# Patient Record
Sex: Male | Born: 1935 | Race: Black or African American | Hispanic: No | Smoking: Current every day smoker
Health system: Southern US, Community
[De-identification: ages and names within clinical notes are randomized; demographics above are authoritative.]

## PROBLEM LIST (undated history)

## (undated) DIAGNOSIS — H269 Unspecified cataract: Secondary | ICD-10-CM

## (undated) DIAGNOSIS — N429 Disorder of prostate, unspecified: Secondary | ICD-10-CM

## (undated) DIAGNOSIS — C801 Malignant (primary) neoplasm, unspecified: Secondary | ICD-10-CM

## (undated) DIAGNOSIS — I499 Cardiac arrhythmia, unspecified: Secondary | ICD-10-CM

## (undated) DIAGNOSIS — M1990 Unspecified osteoarthritis, unspecified site: Secondary | ICD-10-CM

## (undated) DIAGNOSIS — R413 Other amnesia: Secondary | ICD-10-CM

## (undated) DIAGNOSIS — I10 Essential (primary) hypertension: Secondary | ICD-10-CM

## (undated) HISTORY — PX: HERNIA REPAIR: SHX51

## (undated) HISTORY — DX: Unspecified osteoarthritis, unspecified site: M19.90

## (undated) HISTORY — DX: Essential (primary) hypertension: I10

## (undated) HISTORY — DX: Unspecified cataract: H26.9

---

## 2004-09-05 ENCOUNTER — Emergency Department: Payer: Self-pay | Admitting: Emergency Medicine

## 2004-09-05 ENCOUNTER — Other Ambulatory Visit: Payer: Self-pay

## 2007-06-02 ENCOUNTER — Emergency Department: Payer: Self-pay | Admitting: Emergency Medicine

## 2008-06-26 ENCOUNTER — Emergency Department: Payer: Self-pay | Admitting: Emergency Medicine

## 2008-08-15 ENCOUNTER — Emergency Department: Payer: Self-pay | Admitting: Emergency Medicine

## 2008-08-27 ENCOUNTER — Emergency Department: Payer: Self-pay | Admitting: Emergency Medicine

## 2009-05-04 ENCOUNTER — Emergency Department: Payer: Self-pay | Admitting: Unknown Physician Specialty

## 2010-01-05 ENCOUNTER — Ambulatory Visit: Payer: Self-pay | Admitting: Ophthalmology

## 2010-01-19 ENCOUNTER — Ambulatory Visit: Payer: Self-pay | Admitting: Ophthalmology

## 2011-04-28 ENCOUNTER — Emergency Department: Payer: Self-pay | Admitting: Emergency Medicine

## 2011-10-11 ENCOUNTER — Emergency Department: Payer: Self-pay | Admitting: Emergency Medicine

## 2011-10-13 ENCOUNTER — Emergency Department: Payer: Self-pay | Admitting: Unknown Physician Specialty

## 2012-02-08 ENCOUNTER — Ambulatory Visit: Payer: Self-pay | Admitting: Ophthalmology

## 2012-02-28 ENCOUNTER — Ambulatory Visit: Payer: Self-pay | Admitting: Ophthalmology

## 2013-04-16 ENCOUNTER — Ambulatory Visit: Payer: Self-pay | Admitting: Urology

## 2013-04-19 ENCOUNTER — Emergency Department: Payer: Self-pay | Admitting: Emergency Medicine

## 2013-04-19 LAB — COMPREHENSIVE METABOLIC PANEL
Anion Gap: 9 (ref 7–16)
BUN: 14 mg/dL (ref 7–18)
Chloride: 104 mmol/L (ref 98–107)
Creatinine: 0.98 mg/dL (ref 0.60–1.30)
EGFR (African American): >60
Glucose: 110 mg/dL — ABNORMAL HIGH (ref 65–99)
Osmolality: 277 (ref 275–301)
Potassium: 4.2 mmol/L (ref 3.5–5.1)
SGOT(AST): 29 U/L (ref 15–37)
SGPT (ALT): 28 U/L (ref 12–78)
Sodium: 138 mmol/L (ref 136–145)
Total Protein: 7.6 g/dL (ref 6.4–8.2)

## 2013-04-19 LAB — CBC
HGB: 14.2 g/dL (ref 13.0–18.0)
MCH: 25.3 pg — ABNORMAL LOW (ref 26.0–34.0)
MCHC: 32.6 g/dL (ref 32.0–36.0)
MCV: 78 fL — ABNORMAL LOW (ref 80–100)
Platelet: 166 10*3/uL (ref 150–440)
RBC: 5.62 10*6/uL (ref 4.40–5.90)
WBC: 4.6 10*3/uL (ref 3.8–10.6)

## 2013-04-22 ENCOUNTER — Ambulatory Visit: Payer: Self-pay | Admitting: Urology

## 2013-04-23 LAB — PATHOLOGY REPORT

## 2013-04-25 ENCOUNTER — Emergency Department: Payer: Self-pay | Admitting: Emergency Medicine

## 2013-04-25 LAB — TROPONIN I: Troponin-I: <0.02 ng/mL

## 2013-04-25 LAB — CBC
MCV: 77 fL — ABNORMAL LOW (ref 80–100)
RDW: 14 % (ref 11.5–14.5)

## 2013-04-25 LAB — COMPREHENSIVE METABOLIC PANEL
Albumin: 2.6 g/dL — ABNORMAL LOW (ref 3.4–5.0)
Calcium, Total: 8.7 mg/dL (ref 8.5–10.1)
Co2: 30 mmol/L (ref 21–32)
Creatinine: 0.95 mg/dL (ref 0.60–1.30)
EGFR (African American): >60
Glucose: 109 mg/dL — ABNORMAL HIGH (ref 65–99)
Potassium: 4.2 mmol/L (ref 3.5–5.1)
SGOT(AST): 35 U/L (ref 15–37)
Sodium: 137 mmol/L (ref 136–145)
Total Protein: 6.9 g/dL (ref 6.4–8.2)

## 2013-04-29 ENCOUNTER — Emergency Department: Payer: Self-pay | Admitting: Emergency Medicine

## 2013-05-16 ENCOUNTER — Ambulatory Visit: Payer: Self-pay | Admitting: Unknown Physician Specialty

## 2013-06-20 ENCOUNTER — Ambulatory Visit: Payer: Self-pay | Admitting: Hematology and Oncology

## 2013-06-24 ENCOUNTER — Ambulatory Visit: Payer: Self-pay | Admitting: Unknown Physician Specialty

## 2013-06-26 LAB — PATHOLOGY REPORT

## 2013-09-16 ENCOUNTER — Emergency Department: Payer: Self-pay | Admitting: Emergency Medicine

## 2013-09-16 LAB — COMPREHENSIVE METABOLIC PANEL
Albumin: 3.3 g/dL — ABNORMAL LOW (ref 3.4–5.0)
Alkaline Phosphatase: 107 U/L (ref 50–136)
Calcium, Total: 9.1 mg/dL (ref 8.5–10.1)
Osmolality: 274 (ref 275–301)
Potassium: 4.2 mmol/L (ref 3.5–5.1)
SGOT(AST): 29 U/L (ref 15–37)
SGPT (ALT): 23 U/L (ref 12–78)

## 2013-09-16 LAB — URINALYSIS, COMPLETE
Bacteria: NONE SEEN
Bilirubin,UR: NEGATIVE
Glucose,UR: NEGATIVE mg/dL (ref 0–75)
Leukocyte Esterase: NEGATIVE
Ph: 7 (ref 4.5–8.0)
Protein: NEGATIVE
RBC,UR: 2 /HPF (ref 0–5)
Specific Gravity: 1.014 (ref 1.003–1.030)

## 2013-09-16 LAB — CBC
HCT: 44.6 % (ref 40.0–52.0)
HGB: 14.6 g/dL (ref 13.0–18.0)
MCV: 77 fL — ABNORMAL LOW (ref 80–100)
RDW: 14.7 % — ABNORMAL HIGH (ref 11.5–14.5)
WBC: 4.7 10*3/uL (ref 3.8–10.6)

## 2013-09-16 LAB — LIPASE, BLOOD: Lipase: 141 U/L (ref 73–393)

## 2014-04-07 LAB — PRO B NATRIURETIC PEPTIDE: B-Type Natriuretic Peptide: 2445 pg/mL — ABNORMAL HIGH (ref 0–450)

## 2014-04-07 LAB — COMPREHENSIVE METABOLIC PANEL
ALT: 27 U/L (ref 12–78)
ANION GAP: 4 — AB (ref 7–16)
Albumin: 3.1 g/dL — ABNORMAL LOW (ref 3.4–5.0)
Alkaline Phosphatase: 86 U/L
BUN: 9 mg/dL (ref 7–18)
Bilirubin,Total: 0.8 mg/dL (ref 0.2–1.0)
CALCIUM: 9.2 mg/dL (ref 8.5–10.1)
CHLORIDE: 105 mmol/L (ref 98–107)
CO2: 28 mmol/L (ref 21–32)
Creatinine: 0.97 mg/dL (ref 0.60–1.30)
EGFR (African American): >60
EGFR (Non-African Amer.): >60
Glucose: 99 mg/dL (ref 65–99)
OSMOLALITY: 273 (ref 275–301)
Potassium: 4.3 mmol/L (ref 3.5–5.1)
SGOT(AST): 24 U/L (ref 15–37)
SODIUM: 137 mmol/L (ref 136–145)
Total Protein: 7.2 g/dL (ref 6.4–8.2)

## 2014-04-07 LAB — URINALYSIS, COMPLETE
Bacteria: NONE SEEN
Bilirubin,UR: NEGATIVE
Glucose,UR: NEGATIVE mg/dL (ref 0–75)
KETONE: NEGATIVE
LEUKOCYTE ESTERASE: NEGATIVE
NITRITE: NEGATIVE
Ph: 5 (ref 4.5–8.0)
Protein: NEGATIVE
RBC,UR: 4 /HPF (ref 0–5)
Specific Gravity: 1.013 (ref 1.003–1.030)
Squamous Epithelial: NONE SEEN
WBC UR: 1 /HPF (ref 0–5)

## 2014-04-07 LAB — CK TOTAL AND CKMB (NOT AT ARMC)
CK, TOTAL: 69 U/L
CK, Total: 77 U/L
CK-MB: 1.2 ng/mL (ref 0.5–3.6)
CK-MB: 1.3 ng/mL (ref 0.5–3.6)

## 2014-04-07 LAB — CBC
HCT: 42.7 % (ref 40.0–52.0)
HGB: 13.6 g/dL (ref 13.0–18.0)
MCH: 24.8 pg — AB (ref 26.0–34.0)
MCHC: 31.9 g/dL — ABNORMAL LOW (ref 32.0–36.0)
MCV: 78 fL — AB (ref 80–100)
PLATELETS: 146 10*3/uL — AB (ref 150–440)
RBC: 5.48 10*6/uL (ref 4.40–5.90)
RDW: 15.9 % — ABNORMAL HIGH (ref 11.5–14.5)
WBC: 5 10*3/uL (ref 3.8–10.6)

## 2014-04-07 LAB — PROTIME-INR
INR: 1.1
PROTHROMBIN TIME: 13.6 s (ref 11.5–14.7)

## 2014-04-07 LAB — D-DIMER(ARMC): D-Dimer: 874 ng/ml

## 2014-04-07 LAB — TROPONIN I

## 2014-04-07 LAB — LIPASE, BLOOD: Lipase: 227 U/L (ref 73–393)

## 2014-04-07 LAB — APTT: Activated PTT: 35.3 secs (ref 23.6–35.9)

## 2014-04-08 ENCOUNTER — Inpatient Hospital Stay: Payer: Self-pay | Admitting: Internal Medicine

## 2014-04-08 LAB — COMPREHENSIVE METABOLIC PANEL
AST: 23 U/L (ref 15–37)
Albumin: 2.8 g/dL — ABNORMAL LOW (ref 3.4–5.0)
Alkaline Phosphatase: 71 U/L
Anion Gap: 4 — ABNORMAL LOW (ref 7–16)
BUN: 7 mg/dL (ref 7–18)
Bilirubin,Total: 1 mg/dL (ref 0.2–1.0)
CREATININE: 1.09 mg/dL (ref 0.60–1.30)
Calcium, Total: 8.6 mg/dL (ref 8.5–10.1)
Chloride: 106 mmol/L (ref 98–107)
Co2: 29 mmol/L (ref 21–32)
EGFR (Non-African Amer.): >60
Glucose: 110 mg/dL — ABNORMAL HIGH (ref 65–99)
Osmolality: 276 (ref 275–301)
Potassium: 3.9 mmol/L (ref 3.5–5.1)
SGPT (ALT): 22 U/L (ref 12–78)
Sodium: 139 mmol/L (ref 136–145)
TOTAL PROTEIN: 6.5 g/dL (ref 6.4–8.2)

## 2014-04-08 LAB — CBC WITH DIFFERENTIAL/PLATELET
Basophil #: 0 10*3/uL (ref 0.0–0.1)
Basophil %: 0.8 %
EOS ABS: 0.2 10*3/uL (ref 0.0–0.7)
Eosinophil %: 4.3 %
HCT: 41.9 % (ref 40.0–52.0)
HGB: 13.2 g/dL (ref 13.0–18.0)
Lymphocyte #: 1.1 10*3/uL (ref 1.0–3.6)
Lymphocyte %: 30.1 %
MCH: 24.6 pg — AB (ref 26.0–34.0)
MCHC: 31.5 g/dL — ABNORMAL LOW (ref 32.0–36.0)
MCV: 78 fL — ABNORMAL LOW (ref 80–100)
MONO ABS: 0.5 x10 3/mm (ref 0.2–1.0)
Monocyte %: 12 %
Neutrophil #: 2 10*3/uL (ref 1.4–6.5)
Neutrophil %: 52.8 %
Platelet: 144 10*3/uL — ABNORMAL LOW (ref 150–440)
RBC: 5.37 10*6/uL (ref 4.40–5.90)
RDW: 15.9 % — ABNORMAL HIGH (ref 11.5–14.5)
WBC: 3.8 10*3/uL (ref 3.8–10.6)

## 2014-04-08 LAB — CK TOTAL AND CKMB (NOT AT ARMC)
CK, Total: 54 U/L
CK-MB: 1.3 ng/mL (ref 0.5–3.6)

## 2014-04-09 LAB — LIPID PANEL
CHOLESTEROL: 109 mg/dL (ref 0–200)
HDL: 55 mg/dL (ref 40–60)
Ldl Cholesterol, Calc: 42 mg/dL (ref 0–100)
Triglycerides: 62 mg/dL (ref 0–200)
VLDL CHOLESTEROL, CALC: 12 mg/dL (ref 5–40)

## 2014-04-09 LAB — BASIC METABOLIC PANEL
ANION GAP: 4 — AB (ref 7–16)
BUN: 10 mg/dL (ref 7–18)
CO2: 28 mmol/L (ref 21–32)
CREATININE: 1.12 mg/dL (ref 0.60–1.30)
Calcium, Total: 9.1 mg/dL (ref 8.5–10.1)
Chloride: 103 mmol/L (ref 98–107)
EGFR (Non-African Amer.): >60
GLUCOSE: 95 mg/dL (ref 65–99)
Osmolality: 269 (ref 275–301)
Potassium: 3.8 mmol/L (ref 3.5–5.1)
Sodium: 135 mmol/L — ABNORMAL LOW (ref 136–145)

## 2014-04-12 LAB — CULTURE, BLOOD (SINGLE)

## 2014-07-06 ENCOUNTER — Emergency Department: Payer: Self-pay | Admitting: Emergency Medicine

## 2015-03-13 NOTE — Op Note (Signed)
PATIENT NAME:  Wayne Le, Wayne Le MR#:  [id number] DATE OF BIRTH:  Dec 26, 1926  DATE OF PROCEDURE:  04/22/2013  PREOPERATIVE DIAGNOSIS: Right inguinal hernia.   POSTOPERATIVE DIAGNOSIS: Right inguinal hernia.   PROCEDURES: 1.  Right inguinal herniorrhaphy.  2.  Right spermatic cord block.   SURGEON: Yves Dill.   ANESTHETISTS: Eston Esters.   ANESTHETIC METHOD: General, per Marcello Moores, and spermatic cord block per Dr. Yves Dill.   INDICATIONS: See the dictated history and physical.   After informed consent, the patient requests the above procedure.   OPERATIVE SUMMARY: After adequate general anesthesia had been obtained, the lower abdomen and perineum were prepped and draped in the usual fashion. Right inguinal crease incision was made and carried down sharply through the skin, through the subcutaneous  fatty tissue with electrocautery. External oblique fibers were fully exposed. A hernia defect was noted. Spermatic cord fibers were then divided along their course, exposing the spermatic cord. The spermatic cord was then delivered onto the surgical field. Cremasteric muscle fibers were divided, exposing the hernia sac. The hernia sac was then dissected off the cord back to the internal ring. Hernia sac was ligated at the level of the internal ring with  2-0 Surgilon, and then excess sac submitted to pathology.   At this point, using finger dissection, the retropubic space was developed to accommodate the inner portion of the graft. The retropubic space was then irrigated with irrigant. The circular portion of the PHS graft was then placed in the retropubic space and unfurled. The external portion of the graft was then laid parallel to the external oblique fibers just beneath the external oblique fibers. A keyhole incision was made laterally in the external portion of the graft to accommodate the spermatic cord. Edges of this keyhole incision were brought around the cord and anchored to the inguinal  ligament with 2-0 Surgilon suture. The distal portion of the oblong section of the external portion of the graft was then sutured to the pubic tubercle with  2-0 Surgilon suture. A surgical field was then irrigated with GU irrigant. The external oblique fascia was then closed with interrupted 2-0 Surgilon suture. A spermatic cord block was performed with 10 mL of 0.5% Marcaine. A subcutaneous block was performed with  0.5% Marcaine mixed with 1% Xylocaine. Subcutaneous fatty tissue was reapproximated with interrupted 3-0 chromic, and skin was closed with 4-0 Vicryl subcuticular closure followed by Dermabond, benzoin, Steri-Strips, and a sterile dressing.   Sponge, needle, and instrument counts were noted to be correct. The procedure was then terminated and the patient was transferred to the recovery room in stable condition.     ____________________________ Otelia Limes. Yves Dill, MD [email or web address] D: 04/22/2013 12:17:04 ET T: 04/22/2013 12:37:08 ET JOB#: [id number]  cc: Otelia Limes. Yves Dill, MD, <Dictator> Royston Cowper MD ELECTRONICALLY SIGNED 04/22/2013 15:12
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
## 2015-03-14 NOTE — Consult Note (Signed)
CHIEF COMPLAINT and HISTORY:  Subjective/Chief Complaint chest pain   History of Present Illness Patient admitted and seen last night. Note entry delayed.  Has chest pain for several days.  Unclear etiology, but Cards has seen and having cardiac evaluation for chest pain.  CT of chest without contrast was done and I have reviewed this.  Has mild dilatation of the ascending aorta to 4.3 cm in maximal diameter.  Unable to make any comment on flow/dissection at this was an uninfused scan.  Remainder of the aorta visualized has atherosclerotic changes but is not aneurysmal.   PAST MEDICAL/SURGICAL HISTORY:  Past Medical History:   Prostatitis:    irregular heartbeat:    right inguinal hernia repair:    other:    Hemorrhoidectomy:   ALLERGIES:  Allergies:  Iodine: Anaphylaxis  Shellfish: Anaphylaxis  Penicillin: Hives  Other- Explain in Comments Line: Unknown  HOME MEDICATIONS:  Home Medications: Medication Instructions Status  docusate sodium 100 mg oral capsule 2 cap(s) orally 2 times a day Active  MiraLax - oral powder for reconstitution 17 gram(s) orally once a day, As Needed - for Constipation Active  Avodart 0.5 mg oral capsule 1 cap(s) orally once a day Active  Fish Oil 500 mg oral capsule 1 cap(s) orally once a day Active  acetaminophen-oxyCODONE 325 mg-5 mg oral tablet 1 tab(s) orally every 3 to 4 hours, As Needed - for Pain Active   Family and Social History:  Family History Non-Contributory   Social History positive  tobacco (Current within 1 year), positive ETOH, negative Illicit drugs   Place of Living Home   Review of Systems:  Fever/Chills No   Cough No   Sputum No   Abdominal Pain No   Diarrhea No   Constipation No   Nausea/Vomiting No   SOB/DOE Yes   Chest Pain Yes   Telemetry Reviewed NSR   Dysuria No   Tolerating PT Yes   Tolerating Diet Yes   Medications/Allergies Reviewed Medications/Allergies reviewed   Physical Exam:  GEN no  acute distress, thin   HEENT pink conjunctivae, moist oral mucosa   NECK No masses  trachea midline   RESP normal resp effort  no use of accessory muscles   CARD regular rate  no JVD   VASCULAR ACCESS none   ABD denies tenderness  soft   GU no superpubic tenderness   LYMPH negative neck, negative axillae   EXTR negative cyanosis/clubbing, negative edema   SKIN normal to palpation, skin turgor good   NEURO cranial nerves intact, motor/sensory function intact   PSYCH alert, A+O to time, place, person   LABS:  Laboratory Results: LabObservation:    18-May-15 16:14, Echo Doppler  OBSERVATION   Reason for Test  Hepatic:    18-May-15 13:32, Comprehensive Metabolic Panel  Bilirubin, Total 0.8  Alkaline Phosphatase 86  45-117  NOTE: New Reference Range  10/11/13  SGPT (ALT) 27  SGOT (AST) 24  Total Protein, Serum 7.2  Albumin, Serum 3.1    19-May-15 04:10, Comprehensive Metabolic Panel  Bilirubin, Total 1.0  Alkaline Phosphatase 71  45-117  NOTE: New Reference Range  10/11/13  SGPT (ALT) 22  SGOT (AST) 23  Total Protein, Serum 6.5  Albumin, Serum 2.8  Routine Micro:    18-May-15 15:34, Blood Culture  Micro Text Report   BLOOD CULTURE    COMMENT                   NO GROWTH IN 8-12 HOURS  ANTIBIOTIC  Culture Comment   NO GROWTH IN 8-12 HOURS   Result(s) reported on 07 Apr 2014 at 11:00PM.    18-May-15 15:49, Blood Culture  Micro Text Report   BLOOD CULTURE    COMMENT                   NO GROWTH IN 8-12 HOURS     ANTIBIOTIC  Culture Comment   NO GROWTH IN 8-12 HOURS   Result(s) reported on 07 Apr 2014 at 11:00PM.  Cardiology:    18-May-15 16:14, Echo Doppler  Echo Doppler   REASON FOR EXAM:      COMMENTS:       PROCEDURE: [facility] - ECHO DOPPLER COMPLETE(TRANSTHOR)  - Apr 07 2014  4:14PM     RESULT: Echocardiogram Report    Patient Name:   Wayne Le Date of Exam: 04/07/2014  Medical Rec #:  [id number]              Custom1:  Date of Birth:   [birth year]           Height:       67.0 in  Patient Age:    81 years            Weight:       130.0 lb  Patient Gender: M                   BSA:          1.68 m??    Indications: Angina  Sonographer:    Arville Go RDCS  Referring Phys: Graciella Freer, H    Sonographer Comments: Suboptimal parasternal window.    Summary:   1. Left ventricular ejection fraction, by visual estimation, is 50 to   55%.   2. Normal global left ventricular systolic function.   3. Mild to moderately increased left ventricular internal cavity size.   4. Mild mitral valve regurgitation.   5. Mildly increased left ventricular posterior wall thickness.   6. Mild tricuspid regurgitation.  2D AND M-MODE MEASUREMENTS (normal ranges within parentheses):  Left Ventricle:          Normal  IVSd (2D):      1.12 cm (0.7-1.1)  LVPWd (2D):     1.15 cm (0.7-1.1) Aorta/LA:                  Normal  LVIDd (2D):     5.45 cm (3.4-5.7) Aortic Root (2D): 4.00 cm (2.4-3.7)  LVIDs (2D):     3.79 cm           Left Atrium (2D): 3.60 cm (1.9-4.0)  LV FS (2D):     30.5 %   (>25%)  LV EF (2D):     57.4 %   (>50%)                                    Right Ventricle:                                    RVd (2D):  LV DIASTOLIC FUNCTION:  MV Peak E: 0.61 m/s Decel Time: 274 msec  MV Peak A: 1.11 m/s  E/A Ratio: 0.55  SPECTRAL DOPPLER ANALYSIS (where applicable):  Mitral Valve:  MV P1/2 Time: 79.46 msec  MV Area, PHT: 2.77 cm??  Aortic  Valve: AoV Max Vel: 1.25 m/s AoV Peak PG: 6.2 mmHg AoV Mean PG:  LVOT Vmax:  LVOT VTI:  LVOT Diameter: 2.20 cm  Aortic Insufficiency:  AI Half-time:  392 msec  AI Decel Rate: 3.70 m/s??  Tricuspid Valve and PA/RV Systolic Pressure: TR Max Velocity: 2.21 m/s RA   Pressure:  RVSP/PASP:    PHYSICIAN INTERPRETATION:  Left Ventricle: The left ventricular internal cavity size was mild to   moderately increased. LV posterior wall thickness was mildly increased.   Global LV systolic function was normal.  Left ventricular ejection   fraction, by visual estimation, is 50 to 55%.  Right Ventricle: Normal right ventricular size, wall thickness, and   systolic function. RV wall thickness is normal.  Left Atrium: The left atrium is normal in size and structure.  Right Atrium: The right atrium is normal in size and structure.  Pericardium: There is no evidence of pericardial effusion.  Mitral Valve: Mild mitral valve regurgitation is seen.  Tricuspid Valve: Mild tricuspid regurgitation is visualized.  Aortic Valve: The aortic valve is trileaflet and structurally normal,   with normal leaflet excursion; without any evidence of aortic stenosis or   insufficiency.  Pulmonic Valve: Structurally normal pulmonic valve, with normal leaflet   excursion.  Aorta: The aortic root and ascending aorta are structurally normal, with   no evidence of dilitation.  Shunts: There is no evidence of a patent foramen ovale. There is no   evidence of an atrial septal defect.  [place] MD  Electronically signed by [id number] Serafina Royals MD  Signature Date/Time: 04/08/2014/8:03:24 AM    *** Final ***    IMPRESSION: .        Verified By: Corey Skains  ([facility] [employer]), M.D., MD  Routine Chem:    18-May-15 13:32, B-Type Natriuretic Peptide [facility])  B-Type Natriuretic Peptide [facility]) [id number]  Result(s) reported on 07 Apr 2014 at 02:07PM.    18-May-15 13:32, Comprehensive Metabolic Panel  Glucose, Serum 99  BUN 9  Creatinine (comp) 0.97  Sodium, Serum 137  Potassium, Serum 4.3  Chloride, Serum 105  CO2, Serum 28  Calcium (Total), Serum 9.2  Osmolality (calc) 273  eGFR (African American) >60  eGFR (Non-African American) >60  eGFR values <14m/min/1.73 m2 may be an indication of chronic  kidney disease (CKD).  Calculated eGFR is useful in patients with stable renal function.  The eGFR calculation will not be reliable in acutely ill patients  when serum creatinine is changing rapidly. It is not useful in    patients on dialysis. The eGFR calculation may not be applicable  to patients at the low and high extremes of body sizes, pregnant  women, and vegetarians.  Anion Gap 4    18-May-15 13:32, Lipase  Lipase 227  Result(s) reported on 07 Apr 2014 at 02:07PM.    18-May-15 13:32, Prothrombin Time  Result Comment   PTINR - SPECIMEN QNS FOR TESTING. NOTIFIED   -Anda KraftCLARK,RN @ 1350 04/07/14.   - LAB TO CANCEL AND REORDER. RN TO   - RECOLLECT.SRB   Result(s) reported on 07 Apr 2014 at 01:51PM.    19-May-15 04:10, Comprehensive Metabolic Panel  Glucose, Serum 110  BUN 7  Creatinine (comp) 1.09  Sodium, Serum 139  Potassium, Serum 3.9  Chloride, Serum 106  CO2, Serum 29  Calcium (Total), Serum 8.6  Osmolality (calc) 276  eGFR (African American) >60  eGFR (Non-African American) >60  eGFR values <626mmin/1.73 m2 may be an indication  of chronic  kidney disease (CKD).  Calculated eGFR is useful in patients with stable renal function.  The eGFR calculation will not be reliable in acutely ill patients  when serum creatinine is changing rapidly. It is not useful in   patients on dialysis. The eGFR calculation may not be applicable  to patients at the low and high extremes of body sizes, pregnant  women, and vegetarians.  Anion Gap 4  Cardiac:    18-May-15 13:32, Troponin I  Troponin I < 0.02  0.00-0.05  0.05 ng/mL or less: NEGATIVE   Repeat testing in 3-6 hrs   if clinically indicated.  >0.05 ng/mL: POTENTIAL   MYOCARDIAL INJURY. Repeat   testing in 3-6 hrs if   clinically indicated.  NOTE: An increase or decrease   of 30% or more on serial   testing suggests a   clinically important change    18-May-15 17:28, Cardiac Panel  CK, Total 77  39-308  NOTE: NEW REFERENCE RANGE   12/23/2013  CPK-MB, Serum 1.2  Result(s) reported on 07 Apr 2014 at 06:06PM.    18-May-15 17:28, Troponin I  Troponin I < 0.02  0.00-0.05  0.05 ng/mL or less: NEGATIVE   Repeat testing in 3-6 hrs    if clinically indicated.  >0.05 ng/mL: POTENTIAL   MYOCARDIAL INJURY. Repeat   testing in 3-6 hrs if   clinically indicated.  NOTE: An increase or decrease   of 30% or more on serial   testing suggests a   clinically important change    18-May-15 21:35, Cardiac Panel  CK, Total 69  39-308  NOTE: NEW REFERENCE RANGE   12/23/2013  CPK-MB, Serum 1.3  Result(s) reported on 07 Apr 2014 at 10:29PM.    18-May-15 21:35, Troponin I  Troponin I < 0.02  0.00-0.05  0.05 ng/mL or less: NEGATIVE   Repeat testing in 3-6 hrs   if clinically indicated.  >0.05 ng/mL: POTENTIAL   MYOCARDIAL INJURY. Repeat   testing in 3-6 hrs if   clinically indicated.  NOTE: An increase or decrease   of 30% or more on serial   testing suggests a   clinically important change  Routine UA:    18-May-15 13:32, Urinalysis  Color (UA) Yellow  Clarity (UA) Clear  Glucose (UA) Negative  Bilirubin (UA) Negative  Ketones (UA) Negative  Specific Gravity (UA) 1.013  Blood (UA) 1+  pH (UA) 5.0  Protein (UA) Negative  Nitrite (UA) Negative  Leukocyte Esterase (UA) Negative  Result(s) reported on 07 Apr 2014 at 01:55PM.  RBC (UA) 4 /HPF  WBC (UA) 1 /HPF  Bacteria (UA)   NONE SEEN  Epithelial Cells (UA)   NONE SEEN  Mucous (UA) PRESENT  Result(s) reported on 07 Apr 2014 at 01:55PM.  Routine Coag:    18-May-15 13:32, Prothrombin Time  Prothrombin -  INR -  INR reference interval applies to patients on anticoagulant therapy.  A single INR therapeutic range for coumarins is not optimal for all  indications; however, the suggested range for most indications is  2.0 - 3.0.  Exceptions to the INR Reference Range may include: Prosthetic heart  valves, acute myocardial infarction, prevention of myocardial  infarction, and combinations of aspirin and anticoagulant. The need  for a higher or lower target INR must be assessed individually.  Reference: The Pharmacology and Management of the Vitamin K    antagonists: the seventh ACCP Conference on Antithrombotic and  Thrombolytic Therapy. YIRSW.5462 Sept:126 (3suppl): [id number].  A HCT  value >55% may artifactually increase the PT.  In one study,   the increase was an average of 25%.  Reference:  "Effect on Routine and Special Coagulation Testing Values  of Citrate Anticoagulant Adjustment in Patients with High HCT Values."  American Journal of Clinical Pathology 2006;126:400-405.    18-May-15 13:55, Activated PTT  Activated PTT (APTT) 35.3  A HCT value >55% may artifactually increase the APTT. In one study,  the increase was an average of 19%.  Reference: "Effect on Routine and Special Coagulation Testing Values  of Citrate Anticoagulant Adjustment in Patients with High HCT Values."  American Journal of Clinical Pathology 2006;126:400-405.    18-May-15 13:55, D-Dimer, Quantitative  D-Dimer, Quantitative 874  INTERPRETATION  <> Exclusion of Venous Thromboembolism (VTE) - OUTPATIENT ONLY        (Emergency Department or [place])              0-499 ng/ml (FEU)  : With a low to intermediate pretest                                   probability for VTE this test result                                   excludes the diagnosis of VTE.              > 499 ng/ml (FEU)  : VTE not excluded; additional work up                                   for VTE is required.  <> Testing on Inpatients and Evaluation of Disseminated Intravascular         Coagulation (DIC)              Reference Range:  0-499 ng/ml (FEU)    18-May-15 13:55, Prothrombin Time  Prothrombin 13.6  INR 1.1  INR reference interval applies to patients on anticoagulant therapy.  A single INR therapeutic range for coumarins is not optimal for all  indications; however, the suggested range for most indications is  2.0 - 3.0.  Exceptions to the INR Reference Range may include: Prosthetic heart  valves, acute myocardial infarction, prevention of myocardial  infarction, and combinations  of aspirin and anticoagulant. The need  for a higher or lower target INR must be assessed individually.  Reference: The Pharmacology and Management of the Vitamin K   antagonists: the seventh ACCP Conference on Antithrombotic and  Thrombolytic Therapy. [id number].1157 Sept:126 (3suppl): [id number].  A HCT value >55% may artifactually increase the PT.  In one study,   the increase was an average of 25%.  Reference:  "Effect on Routine and Special Coagulation Testing Values  of Citrate Anticoagulant Adjustment in Patients with High HCT Values."  American Journal of Clinical Pathology 2006;126:400-405.  Routine Hem:    18-May-15 13:32, Hemogram, Platelet Count  WBC (CBC) 5.0  RBC (CBC) 5.48  Hemoglobin (CBC) 13.6  Hematocrit (CBC) 42.7  Platelet Count (CBC) 146  Result(s) reported on 07 Apr 2014 at 01:57PM.  MCV 78  MCH 24.8  MCHC 31.9  RDW 15.9    19-May-15 04:10, CBC Profile  WBC (CBC) 3.8  RBC (CBC) 5.37  Hemoglobin (CBC) 13.2  Hematocrit (CBC) 41.9  Platelet Count (CBC)  144  MCV 78  MCH 24.6  MCHC 31.5  RDW 15.9  Neutrophil % 52.8  Lymphocyte % 30.1  Monocyte % 12.0  Eosinophil % 4.3  Basophil % 0.8  Neutrophil # 2.0  Lymphocyte # 1.1  Monocyte # 0.5  Eosinophil # 0.2  Basophil # 0.0  Result(s) reported on 08 Apr 2014 at 05:10AM.   RADIOLOGY:  Radiology Results: XRay:    30-May-14 09:48, Abdomen Flat and Erect  Abdomen Flat and Erect  REASON FOR EXAM:    constipation pain  COMMENTS:       PROCEDURE: DXR - DXR ABDOMEN 2 V FLAT AND ERECT  - Apr 19 2013  9:48AM     RESULT: Comparison: 10/11/2011    Findings:  Lobular density along the right side of the mediastinum is likely due to   enlarged ascending aorta. No free intraperitoneal air. Air seen within   nondilated small and large bowel. Stool seen in the ascending colon.   Lucencies and sclerosis overlying the femoral head, right greater than   left, may secondary to subchondralcystic change or sequela of  avascular   necrosis.  IMPRESSION:   1. Nonobstructed bowel gas pattern.   2. Findings which likely represent enlarged ascending thoracic aorta.   Dedicated chest radiograph could be performed, as indicated.    Dictation site: 2        Verified By: Gregor Hams, M.D., MD    05-Jun-14 12:02, Abdomen 3 Way Includes PA Chest  Abdomen 3 Way Includes PA Chest  REASON FOR EXAM:    chest/epigastric pain  COMMENTS:       PROCEDURE: DXR - DXR ABDOMEN 3-WAY (INCL PA CXR)  - Apr 25 2013 12:02PM     RESULT: Comparison is made to the study of Apr 19, 2013.    The chest film reveals old deformity of the seventh ribon the left. The   lungs are hyperinflated. The cardiac silhouette is normal in size. A   small amount of blunting of the right lateral costophrenic angle is   present. Within the abdomen the bowel gas pattern is nonspecific. There   may be a fecal impaction given the considerable amount of stool in the   rectum. Small amounts of small bowel gas are demonstrated.    IMPRESSION:   1. The appearance of the abdomen and pelvis suggest a fecal impaction.   The gas pattern is otherwise nonspecific.  2.The chest film reveals findings consistent with COPD. There is no   focal pneumonia nor definite evidence of CHF.     Dictation Site: 2        Verified By: DAVID A. Martinique, M.D., MD    09-Jun-14 06:50, Abdomen 3 Way Includes PA Chest  Abdomen 3 Way Includes PA Chest  REASON FOR EXAM:    recent surgery, no bm x 1 week  COMMENTS:   May transport without cardiac monitor    PROCEDURE: DXR - DXR ABDOMEN 3-WAY (INCL PA CXR)  - Apr 29 2013  6:50AM     RESULT: Comparison is made to the study of April 25, 2013.    The chestfilm reveals the lungs to be well-expanded. There is no focal   infiltrate. There is tortuosity of the ascending and descending thoracic   aorta. The cardiac silhouette is top normal in size. The pulmonary   vascularity is not engorged. Within the abdomen the bowel  gas pattern is   nonspecific. There is no significantly it dilated  bowel but there is a   small amount of small bowel gas with minimal distention noted in the mid   abdomen. There is stool and gas in the region of the rectum. When   compared to the earlier study the stool in the rectal vault has cleared.     There is curvature of the lumbar spine.    IMPRESSION:    1. There is an improved appearance of the rectosigmoid region with a   large fecal ball seen on 5 June no longer evident. There is no evidence   of bowel obstruction the gas pattern remains nonspecific.  2. No acute cardiopulmonary abnormality is demonstrated. There is likely   underlying COPD.  3. There is sclerosis and cystic change within the right femoral head   which appears stable.     Dictation Site: 2        Verified By: DAVID A. Martinique, M.D., MD    18-May-15 13:17, Chest Portable Single View  Chest Portable Single View  REASON FOR EXAM:    Chest Pain  COMMENTS:       PROCEDURE: DXR - DXR PORTABLE CHEST SINGLE VIEW  - Apr 07 2014  1:17PM     CLINICAL DATA:  Chest pain    EXAM:  PORTABLE CHEST - 1 VIEW    COMPARISON:  04/29/2013 acute abdominal series.    FINDINGS:  Mild cardiomegaly. There is tortuosity of the aorta with especially  prominent ascending aorta. The appearance is stable from 04/29/2013.  Chronic lung disease with pulmonary hyperinflation and interstitial  coarsening. There are new coarse interstitial opacities at the  peripheral left base and in the right mid lung. No evidence of  effusion, edema, or pneumothorax (edge at the right apex favor skin  fold). Postsurgical or posttraumatic deformity of the posterior left  seventh rib.     IMPRESSION:  1. Patchy bilateral lung opacities are primarily suspicious for  pneumonia. Recommend followup to radiologic resolution.  2. COPD.  3. Chronic tortuosity or aneurysmal enlargement of the ascending  aorta.    Electronically Signed    By:  Jorje Guild M.D.    On: 04/07/2014 13:28         Verified By: Gilford Silvius, M.D.,  [place]:    30-May-14 09:48, Abdomen Flat and Erect  PACS Image    05-Jun-14 12:02, Abdomen 3 Way Includes PA Chest  PACS Image    09-Jun-14 06:50, Abdomen 3 Way Includes PA Chest  PACS Image    26-Jun-14 11:22, CT Abdomen and Pelvis Without Contrast  PACS Image    18-May-15 13:17, Chest Portable Single View  PACS Image    18-May-15 14:19, CT Chest Without Contrast  PACS Image  CT:    26-Jun-14 11:22, CT Abdomen and Pelvis Without Contrast  CT Abdomen and Pelvis Without Contrast  REASON FOR EXAM:    constipation weight loss anemia   ORAL CONTRAST ONLY   PT ALLERGY  COMMENTS:       PROCEDURE: KCT - KCT ABDOMEN/PELVIS WO  - May 16 2013 11:22AM     RESULT:     Comparison is made to a prior study dated 10/13/2011.    Technique: Helical noncontrast 3 mm sections were obtained from the lung   bases through the pubic symphysis.    Findings: Evaluation of the lungs bases demonstrates interlobar and   subpleural septal thickening as well as subpleural honeycombing. Diffuse     ground-glass density is identified throughout both lungs.  Noncontrast evaluation of the liver, spleen, adrenals, pancreas, and left   kidney is unremarkable. A 6.63 cm cyst is appreciated within the lower   pole of the right kidney, appreciated on previous study and appears   slightly more prominent. The right kidney is otherwise grossly   unremarkable. There is no evidence of an abdominal aortic aneurysm. There   is no evidence of calcified gallstones. There is no CT evidence of bowel   obstruction, enteritis, colitis or diverticulitis. A moderate amount of   fecal retention is identified within the colon. A right sided inguinal   hernia is appreciated. There appears to be indirect and direct   components. This finding appears to contain a portion of a loop of small   bowel. As stated above, there is no  evidence of bowel obstruction.   Subcentimeter mesenteric and retroperitoneal lymph nodes are identified.  IMPRESSION:      1.  Right sided inguinal hernia which appears to contain a loop of small   bowel. There is no evidence of bowel obstruction.  2.  Interstitial fibrotic changes within the lung bases.  3.  Large, right renal cyst.  4.  Fecal retention, moderate.      Thank you for this opportunity to contribute to the care of your patient.         Verified By: Mikki Santee, M.D., MD    18-May-15 14:19, CT Chest Without Contrast  CT Chest Without Contrast  REASON FOR EXAM:    chest pain radiating through to back, tortuous aorta,   IODINE ALLERGY  COMMENTS:       PROCEDURE: CT  - CT CHEST WITHOUT CONTRAST  - Apr 07 2014  2:19PM     CLINICAL DATA:  Chest pain and upper thoracic pain for 1 week,  shortness of breath, smoker, history prostate cancer    EXAM:  CT CHEST WITHOUT CONTRAST    TECHNIQUE:  Multidetector CT imaging of the chest was performed following the  standard protocol without IV contrast. Sagittal and coronal MPR  images reconstructed from axial data set. 7    COMPARISON:  Chest radiograph 04/07/2014    FINDINGS:  Extensive atherosclerotic calcifications aorta and coronary  arteries.    Minimal pericardial effusion.    Slight dilatation of distal aortic arch 4.2 cm transverse.    Ascending thoracic aorta aneurysmally dilated measuring 4.3 x 4.0  cm.  No definite thoracic adenopathy, with hilar assessment limited by  lack of IV contrast.    Visualized portion of upper abdomen unremarkable for technique.    Emphysematous changes with scattered areas ofinterstitial change  and minimal honeycombing question representing usual interstitial  pneumonitis.    Mild central peribronchial thickening.    No pleural effusion, pneumothorax or discrete mass/nodule.    Bones appear diffusely demineralized with old posttraumatic  deformities of the  lateral LEFT seventh rib  Bones demineralized.     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    Emphysematous changes with scattered interstitial thickening/  fibrosis and minimal peripheral honeycombing, most likely  representing fibrosis question usual interstitial pneumonitis.    Osseous demineralization.      Electronically Signed    By: Lavonia Dana M.D.    On: 04/07/2014 14:34         Verified By: Burnetta Sabin, M.D.,   ASSESSMENT AND PLAN:  Assessment/Admission Diagnosis chest pain of unclear etiology mild enlargement of ascending  thoracic aorta to maximal diameter of 4.3 cm on CT scan tobacco use   Plan CT of chest without contrast was done and I have reviewed this.  Has mild dilatation of the ascending aorta to 4.3 cm in maximal diameter.  Unable to make any comment on flow/dissection at this was an uninfused scan.  Remainder of the aorta visualized has atherosclerotic changes but is not aneurysmal. This is very unlikely the cause of his pain and is only borderline aneurysmal.  No intervention needs to be considered for this until it is well over 6 cm in diameter.  Will be happy to see in office in follow up with yearly CT evaluations. BP control and tobacco cessation important factors in limiting growth.   level 4   Electronic Signatures: Algernon Huxley (MD)  (Signed 19-May-15 08:48)  Authored: Chief Complaint and History, PAST MEDICAL/SURGICAL HISTORY, ALLERGIES, HOME MEDICATIONS, Family and Social History, Review of Systems, Physical Exam, LABS, RADIOLOGY, Assessment and Plan   Last Updated: 19-May-15 08:48 by Algernon Huxley (MD)
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
## 2015-03-14 NOTE — Discharge Summary (Signed)
PATIENT NAME:  Wayne Le, Wayne Le MR#:  [id number] DATE OF BIRTH:  01/10/1927  DATE OF ADMISSION:  04/08/2014 DATE OF DISCHARGE:  04/09/2014  ADMITTING DIAGNOSIS: Chest pain, likely unstable angina.   DISCHARGE DIAGNOSES:  1. Unstable angina status post cardiac catheterization on 04/08/2014, status post stent placement   in mid 85% stenotic left anterior descending with 0% residual stenosis.  2. Hypotension related to cardiac disease.  3. Ascending aortic aneurysm.  4. Suspected pneumonia.  5. Mild chronic obstructive pulmonary disease exacerbation.  6. Malnutrition.  7. Benign prostatic hypertrophy.  8. Hyperlipidemia.  9. Chronic diastolic congestive heart failure.  10. Chronic constipation.   DISCHARGE CONDITION: Stable.   DISCHARGE MEDICATIONS: The patient is to continue Avodart to 0.5 mg daily, fish oil 500 mg  p.o. daily, acetaminophen/ oxycodone 325/5 mg 1 tablet every 3-4 hours as needed, docusate sodium 100 mg 2 tablets twice daily, MiraLax 17 grams once daily as needed, nitroglycerin 0.4 mg sublingually every 5 minutes as needed, aspirin 81 mg p.o. daily, Brilinta 90 mg p.o. twice daily, metoprolol succinate 25 mg p.o. twice daily, atenolol 250/50 one puff twice daily, Combivent Respimat 1 puff 4 times daily as needed, Levaquin 250 mg p.o. every 24 hours for 5 more days, nicotine oral inhaler 1 inhalation every 1-2 hours as needed, nicotine transdermal patch 14 mg topically daily, Ensure Plus 237 mL 3 times daily as needed.   HOME OXYGEN: None.   DIET: Low salt, low fat, low cholesterol, with Ensure 3 times daily as needed, regular consistency.   ACTIVITY LIMITATIONS: As tolerated.   FOLLOWUP APPOINTMENT: With Dr. Nehemiah Massed in 2 days after discharge.  Patient was advised not to smoke, as it may cause worsening aortic dilatation or expansion which may lead to it bursting and patient's demise.   CONSULTANTS:  Dr. Nehemiah Massed, care management, social work, Dr. Leotis Pain.   RADIOLOGIC  STUDIES: Chest xray, portable single, 04/07/2014, revealed patchy bilateral lung opacities, which are primarily suspicious for pneumonia. Recommend followup with radiologic resolution. COPD, as well as chronic tortiosity  and aneurysmal enlargement of the ascending aorta. CT scan of chest without contrast, 04/07/2014, revealed extensive atherosclerotic disease with aneurysmal dilatation of the ascending thoracic aorta and aortic arch, emphysematous changes with scattered interstitial thickening, fibrosis, and minimal peripheral honeycombing most likely representing fibrosis, questionable unusual interstitial pneumonitis versus demineralization was also noticed. Cardiac catheterization, 04/08/2014, revealing normal left ventricular function, ejection fraction of 55%, significant one-vessel coronary artery disease, status post successful PCI stent to mid LAD, residual stenosis of 0% from 75% occlusion. Drug-eluting stent was placed by Dr. Clayborn Bigness.   HOSPITAL COURSE:  Patient is an 81 year old male with history of coronary artery disease, who presents to the hospital with complaints of chest pains as well as back pains. Please refer to Dr. Keenan Bachelor admission note on 04/07/2014. On arrival to the hospital, patient's temperature was 97.6, pulse was 74, respiratory rate was 20, blood pressure was 92/79. O2 saturations were 98% on room air. Physical exam revealed a mild systolic murmur at the apex; otherwise, unremarkable.  Patient's EKG showed normal sinus rhythm with sinus arrhythmia at 73 beats per minute, left axis deviation, voltage criteria for LVH, nonspecific ST-T changes, QS in V1-V3.  Patient was admitted to the hospital for further evaluation. Cardiology consultation was obtained. The cardiologist saw patient in consultation and felt that patient would benefit from cardiac catheterization. Cardiac catheterization was performed on 04/08/2014 and patient had a stent placed in the 75% stenotic area of mid  LAD  resulting in 0% stenosis after stent placement.  Patient was advised to continue aspirin as well as Brilinta and follow up with cardiology in the next few days after discharge. In regard to aortic aneurysm, patient was evaluated by Dr. Lucky Cowboy on 04/08/2014 who felt that patient's mild dilatation of the ascending aorta to 4.3 cm in maximum diameter was unlikely cause for the pain and just borderline aneurysmal. He recommended no intervention at this time until it is well over 6 cm in diameter. He recommended to follow up with Dr. Lucky Cowboy in the office for a yearly CPE evaluation.  He is recommended to have blood pressure control as well as tobacco cessation as important factors to limit the growth of aneurysmal dilatation of aorta.  The patient was consulted about tobacco abuse and nicotine replacement therapy was initiated. In regard to hypertension, the patient's blood pressure normalized, improved as soon as  patient received LAD stent. It is recommended to follow patient's blood pressure readings as outpatient and make decisions about initiation on blood pressure medications or advancement of his blood pressure medications if needed. For now, patient is to continue metoprolol.  For his chronic medical problems such as COPD and malnutrition, BPH, hyperlipidemia, patient was advised to continue fish oil and usual management. Since there was concern of a possible pneumonia, Levaquin was initiated. The patient is to continue antibiotics for a total of 7 days. For tobacco abuse, he is to continue nicotine replacement therapy. The patient is being discharged in stable condition with the above-mentioned medications and followup on the day of discharge, temperature was 97.8, pulse was 76, respiration rate was 26, blood pressure 146/59, saturation was 98% on room air at rest.   TIME SPENT: 40 minutes on the patient.    ____________________________ Theodoro Grist, MD rv:dd D: 04/09/2014 18:08:13 ET T: 04/09/2014 21:08:25  ET JOB#: [id number]  cc: Theodoro Grist, MD, <Dictator> Corey Skains, MD  [place] MD ELECTRONICALLY SIGNED 04/23/2014 8:39

## 2015-03-14 NOTE — Consult Note (Signed)
PATIENT NAME:  Wayne Le, Wayne Le MR#:  [id number] DATE OF BIRTH:  Dec 18, 1926  DATE OF CONSULTATION:  04/07/2014  REFERRING PHYSICIAN:  Gladstone Lighter, MD CONSULTING PHYSICIAN:  Corey Skains, MD  REASON AND CONSULTATION AND CHIEF COMPLAINT: "I had chest pain."   HISTORY OF PRESENT ILLNESS: This is an 81 year old male with hypertension, who has had significant progression of shortness of breath and substernal chest discomfort, weakness and fatigue over the last week. This culminated in substernal chest discomfort in the upper chest radiating into the back, associated with shortness of breath over a several-hour period. The patient has had no evidence of significant EKG changes at this time of a myocardial infarction, with normal troponin, but continues to have chest pain, somewhat relieved by nitroglycerin. The patient does have a CAT scan showing severe atherosclerotic coronary artery disease and small aortic aneurysm concerning for other issues, but no dissection. There is some COPD. The patient does not have any other significant abdominal or other symptoms at this time.   REVIEW OF SYSTEMS: The remainder of review of systems negative for vision change, ringing in the ears, hearing loss, cough, congestion, heartburn, nausea, vomiting, diarrhea, bloody stools, stomach pain, extremity pain, leg weakness, cramping of the buttocks, known blood clots, headaches, blackouts, dizzy spells, nosebleeds, congestion, trouble swallowing, frequent urination, urination at night, muscle weakness, numbness, anxiety, depression, skin lesions or skin rashes.   PAST MEDICAL HISTORY: Hypertension.   FAMILY HISTORY: There are multiple family members with early onset of cardiovascular disease.   SOCIAL HISTORY: Has remote tobacco and alcohol use.   ALLERGIES: As listed.  MEDICATIONS: As listed.  PHYSICAL EXAMINATION: VITAL SIGNS: Blood pressure is 110/68 bilaterally, heart rate 72 upright and reclining, and  regular.  GENERAL: He is a well-appearing male in no acute distress.  HEAD, EYES, EARS, NOSE AND THROAT: No icterus, thyromegaly, ulcers, hemorrhage or xanthelasma.  CARDIOVASCULAR: Regular rate and rhythm with normal S1 and S2. A 2/6 left upper sternal border murmur radiating throughout.  LUNGS: Have a few expiratory wheezes.  ABDOMEN: Soft, nontender, with increased abdominal aortic pulsation and bruit.  EXTREMITIES: Show 2+ radial, femoral, dorsal pedal pulses, with no lower extremity edema, cyanosis, clubbing or ulcers.  NEUROLOGIC: Oriented to time, place and person, with normal mood and affect.  ASSESSMENT: An 81 year old male with hypertension, atherosclerotic coronary artery disease, chest pain consistent with unstable angina, with no current evidence of myocardial infarction, and abdominal aortic aneurysm.   RECOMMENDATIONS:  1. Continue serial ECG and enzymes to assess for myocardial infarction.  2. Echocardiogram for LV systolic dysfunction, valvular heart disease causing chest pain.  3. Proceed to cardiac catheterization to assess coronary anatomy and further treatment thereof (Dictation Anomaly)   unstable angina. 4. Further evaluation of abdominal aortic aneurysm and extent and need for further intervention.  5. Hypertension. Heart rate control and consider aspirin for further risk reduction in cardiovascular event.  ____________________________ Corey Skains, MD bjk:lb D: 04/08/2014 07:57:00 ET T: 04/08/2014 08:10:55 ET JOB#: [id number]  cc: Corey Skains, MD, <Dictator> Corey Skains MD ELECTRONICALLY SIGNED 04/09/2014 22:21

## 2015-03-15 NOTE — Op Note (Signed)
PATIENT NAME:  Wayne Le, Wayne Le MR#:  [id number] DATE OF BIRTH:  [birth year]  DATE OF PROCEDURE:  02/28/2012  PREOPERATIVE DIAGNOSIS: Visually significant cataract of the right eye.   POSTOPERATIVE DIAGNOSIS: Visually significant cataract of the right eye.   OPERATIVE PROCEDURE: Cataract extraction by phacoemulsification with implant of intraocular lens to right eye.   SURGEON: Birder Robson, MD.   ANESTHESIA:  1. Managed anesthesia care.  2. Topical tetracaine drops followed by 2% Xylocaine jelly applied in the preoperative holding area.   COMPLICATIONS: None.   TECHNIQUE:  Stop-and-chop    DESCRIPTION OF PROCEDURE: The patient was examined and consented in the preoperative holding area where the aforementioned topical anesthesia was applied to the right eye and then brought back to the Operating Room where the right eye was prepped and draped in the usual sterile ophthalmic fashion and a lid speculum was placed. A paracentesis was created with the side port blade and the anterior chamber was filled with viscoelastic. A near clear corneal incision was performed with the steel keratome. A continuous curvilinear capsulorrhexis was performed with a cystotome followed by the capsulorrhexis forceps. Hydrodissection and hydrodelineation were carried out with BSS on a blunt cannula. The lens was removed in a stop-and-chop technique and the remaining cortical material was removed with the irrigation-aspiration handpiece. The capsular bag was inflated with viscoelastic and the Technus ZCB00 21.0-diopter lens, serial number [id number] was placed in the capsular bag without complication. The remaining viscoelastic was removed from the eye with the irrigation-aspiration handpiece. The wounds were hydrated. The anterior chamber was flushed with Miostat and the eye was inflated to physiologic pressure. The wounds were found to be water tight. The eye was dressed with Vigamox. The patient was given protective  glasses to wear throughout the day and a shield with which to sleep tonight. The patient was also given drops with which to begin a drop regimen today and will follow-up with me in one day.   ____________________________ Livingston Diones. Dominic Mahaney, MD wlp:drc D: 02/28/2012 12:52:14 ET T: 02/28/2012 13:14:14 ET JOB#: [id number]  cc: Evalyse Stroope L. Leather Estis, MD, <Dictator> Livingston Diones Yasheka Fossett MD ELECTRONICALLY SIGNED 02/29/2012 11:41

## 2016-08-24 ENCOUNTER — Inpatient Hospital Stay
Admit: 2016-08-24 | Discharge: 2016-08-24 | Disposition: A | Discharge status: Home / Self Care | Payer: Medicare HMO | Attending: Internal Medicine | Admitting: Internal Medicine

## 2016-08-24 ENCOUNTER — Emergency Department: Payer: Medicare HMO

## 2016-08-24 ENCOUNTER — Encounter: Payer: Self-pay | Admitting: Emergency Medicine

## 2016-08-24 ENCOUNTER — Inpatient Hospital Stay
Admission: EM | Admit: 2016-08-24 | Discharge: 2016-08-25 | DRG: 865 | Disposition: A | Discharge status: Home Health | Payer: Medicare HMO | Attending: Internal Medicine | Admitting: Internal Medicine

## 2016-08-24 DIAGNOSIS — G2581 Restless legs syndrome: Secondary | ICD-10-CM | POA: Diagnosis present

## 2016-08-24 DIAGNOSIS — Z955 Presence of coronary angioplasty implant and graft: Secondary | ICD-10-CM

## 2016-08-24 DIAGNOSIS — R7989 Other specified abnormal findings of blood chemistry: Secondary | ICD-10-CM

## 2016-08-24 DIAGNOSIS — I248 Other forms of acute ischemic heart disease: Secondary | ICD-10-CM | POA: Diagnosis present

## 2016-08-24 DIAGNOSIS — Z79899 Other long term (current) drug therapy: Secondary | ICD-10-CM

## 2016-08-24 DIAGNOSIS — R739 Hyperglycemia, unspecified: Secondary | ICD-10-CM | POA: Diagnosis present

## 2016-08-24 DIAGNOSIS — N179 Acute kidney failure, unspecified: Secondary | ICD-10-CM | POA: Diagnosis present

## 2016-08-24 DIAGNOSIS — Z23 Encounter for immunization: Secondary | ICD-10-CM

## 2016-08-24 DIAGNOSIS — G309 Alzheimer's disease, unspecified: Secondary | ICD-10-CM | POA: Diagnosis present

## 2016-08-24 DIAGNOSIS — J189 Pneumonia, unspecified organism: Secondary | ICD-10-CM | POA: Insufficient documentation

## 2016-08-24 DIAGNOSIS — B349 Viral infection, unspecified: Secondary | ICD-10-CM | POA: Diagnosis not present

## 2016-08-24 DIAGNOSIS — R410 Disorientation, unspecified: Secondary | ICD-10-CM

## 2016-08-24 DIAGNOSIS — J441 Chronic obstructive pulmonary disease with (acute) exacerbation: Secondary | ICD-10-CM | POA: Diagnosis present

## 2016-08-24 DIAGNOSIS — R4182 Altered mental status, unspecified: Secondary | ICD-10-CM | POA: Diagnosis not present

## 2016-08-24 DIAGNOSIS — G9341 Metabolic encephalopathy: Secondary | ICD-10-CM | POA: Diagnosis present

## 2016-08-24 DIAGNOSIS — K529 Noninfective gastroenteritis and colitis, unspecified: Secondary | ICD-10-CM

## 2016-08-24 DIAGNOSIS — R509 Fever, unspecified: Secondary | ICD-10-CM

## 2016-08-24 DIAGNOSIS — R413 Other amnesia: Secondary | ICD-10-CM | POA: Diagnosis present

## 2016-08-24 DIAGNOSIS — F17200 Nicotine dependence, unspecified, uncomplicated: Secondary | ICD-10-CM | POA: Diagnosis present

## 2016-08-24 DIAGNOSIS — I2510 Atherosclerotic heart disease of native coronary artery without angina pectoris: Secondary | ICD-10-CM | POA: Diagnosis present

## 2016-08-24 DIAGNOSIS — R17 Unspecified jaundice: Secondary | ICD-10-CM | POA: Diagnosis present

## 2016-08-24 DIAGNOSIS — N400 Enlarged prostate without lower urinary tract symptoms: Secondary | ICD-10-CM | POA: Diagnosis present

## 2016-08-24 DIAGNOSIS — N289 Disorder of kidney and ureter, unspecified: Secondary | ICD-10-CM

## 2016-08-24 DIAGNOSIS — F0280 Dementia in other diseases classified elsewhere without behavioral disturbance: Secondary | ICD-10-CM | POA: Diagnosis present

## 2016-08-24 DIAGNOSIS — R262 Difficulty in walking, not elsewhere classified: Secondary | ICD-10-CM

## 2016-08-24 DIAGNOSIS — R778 Other specified abnormalities of plasma proteins: Secondary | ICD-10-CM

## 2016-08-24 DIAGNOSIS — J440 Chronic obstructive pulmonary disease with acute lower respiratory infection: Secondary | ICD-10-CM | POA: Diagnosis present

## 2016-08-24 DIAGNOSIS — R2689 Other abnormalities of gait and mobility: Secondary | ICD-10-CM

## 2016-08-24 DIAGNOSIS — A419 Sepsis, unspecified organism: Secondary | ICD-10-CM

## 2016-08-24 HISTORY — DX: Disorder of prostate, unspecified: N42.9

## 2016-08-24 HISTORY — DX: Other amnesia: R41.3

## 2016-08-24 LAB — URINALYSIS COMPLETE WITH MICROSCOPIC (ARMC ONLY)
BACTERIA UA: NONE SEEN
BILIRUBIN URINE: NEGATIVE
GLUCOSE, UA: NEGATIVE mg/dL
Ketones, ur: NEGATIVE mg/dL
Leukocytes, UA: NEGATIVE
Nitrite: NEGATIVE
Protein, ur: 30 mg/dL — AB
Specific Gravity, Urine: 1.024 (ref 1.005–1.030)
pH: 5 (ref 5.0–8.0)

## 2016-08-24 LAB — CBC
HCT: 44.3 % (ref 40.0–52.0)
Hemoglobin: 14 g/dL (ref 13.0–18.0)
MCH: 24.4 pg — AB (ref 26.0–34.0)
MCHC: 31.7 g/dL — AB (ref 32.0–36.0)
MCV: 76.9 fL — AB (ref 80.0–100.0)
PLATELETS: 216 10*3/uL (ref 150–440)
RBC: 5.75 MIL/uL (ref 4.40–5.90)
RDW: 14.5 % (ref 11.5–14.5)
WBC: 9.5 10*3/uL (ref 3.8–10.6)

## 2016-08-24 LAB — COMPREHENSIVE METABOLIC PANEL
ALBUMIN: 3.2 g/dL — AB (ref 3.5–5.0)
ALT: 22 U/L (ref 17–63)
AST: 33 U/L (ref 15–41)
Alkaline Phosphatase: 122 U/L (ref 38–126)
Anion gap: 10 (ref 5–15)
BUN: 20 mg/dL (ref 6–20)
CHLORIDE: 102 mmol/L (ref 101–111)
CO2: 24 mmol/L (ref 22–32)
CREATININE: 1.33 mg/dL — AB (ref 0.61–1.24)
Calcium: 9 mg/dL (ref 8.9–10.3)
GFR calc Af Amer: 54 mL/min — ABNORMAL LOW (ref >60)
GFR, EST NON AFRICAN AMERICAN: 46 mL/min — AB (ref >60)
GLUCOSE: 123 mg/dL — AB (ref 65–99)
Potassium: 4.4 mmol/L (ref 3.5–5.1)
Sodium: 136 mmol/L (ref 135–145)
Total Bilirubin: 2.1 mg/dL — ABNORMAL HIGH (ref 0.3–1.2)
Total Protein: 8.3 g/dL — ABNORMAL HIGH (ref 6.5–8.1)

## 2016-08-24 LAB — PROTIME-INR
INR: 1.2
Prothrombin Time: 15.3 seconds — ABNORMAL HIGH (ref 11.4–15.2)

## 2016-08-24 LAB — URINE DRUG SCREEN, QUALITATIVE (ARMC ONLY)
Amphetamines, Ur Screen: NOT DETECTED
BARBITURATES, UR SCREEN: NOT DETECTED
Benzodiazepine, Ur Scrn: NOT DETECTED
CANNABINOID 50 NG, UR ~~LOC~~: NOT DETECTED
COCAINE METABOLITE, UR ~~LOC~~: NOT DETECTED
MDMA (Ecstasy)Ur Screen: NOT DETECTED
Methadone Scn, Ur: NOT DETECTED
Opiate, Ur Screen: NOT DETECTED
PHENCYCLIDINE (PCP) UR S: NOT DETECTED
TRICYCLIC, UR SCREEN: NOT DETECTED

## 2016-08-24 LAB — TSH: TSH: 2.138 u[IU]/mL (ref 0.350–4.500)

## 2016-08-24 LAB — APTT: APTT: 42 s — AB (ref 24–36)

## 2016-08-24 LAB — TROPONIN I
TROPONIN I: 0.08 ng/mL — AB (ref <0.03)
TROPONIN I: 0.04 ng/mL — AB (ref <0.03)
Troponin I: 0.05 ng/mL (ref <0.03)

## 2016-08-24 LAB — LACTIC ACID, PLASMA: LACTIC ACID, VENOUS: 1.2 mmol/L (ref 0.5–1.9)

## 2016-08-24 LAB — AMMONIA: Ammonia: 15 umol/L (ref 9–35)

## 2016-08-24 LAB — BILIRUBIN, DIRECT: Bilirubin, Direct: 0.4 mg/dL (ref 0.1–0.5)

## 2016-08-24 LAB — GLUCOSE, CAPILLARY: GLUCOSE-CAPILLARY: 119 mg/dL — AB (ref 65–99)

## 2016-08-24 MED ORDER — ONDANSETRON HCL 4 MG/2ML IJ SOLN: 4.0000 mg | Freq: Four times a day (QID) | INTRAMUSCULAR | Status: DC | PRN

## 2016-08-24 MED ORDER — NITROGLYCERIN 2 % TD OINT
1.0000 [in_us] | TOPICAL_OINTMENT | Freq: Four times a day (QID) | TRANSDERMAL | Status: DC
  Administered 2016-08-24 – 2016-08-25 (×4): 1 [in_us] via TOPICAL
  Filled 2016-08-24 (×4): qty 1

## 2016-08-24 MED ORDER — MEMANTINE HCL-DONEPEZIL HCL ER 28-10 MG PO CP24: 1.0000 | ORAL_CAPSULE | Freq: Every day | ORAL | Status: DC

## 2016-08-24 MED ORDER — SODIUM CHLORIDE 0.9% FLUSH
3.0000 mL | Freq: Two times a day (BID) | INTRAVENOUS | Status: DC
  Administered 2016-08-25: 3 mL via INTRAVENOUS

## 2016-08-24 MED ORDER — ENOXAPARIN SODIUM 40 MG/0.4ML ~~LOC~~ SOLN
40.0000 mg | SUBCUTANEOUS | Status: DC
  Administered 2016-08-24: 40 mg via SUBCUTANEOUS
  Filled 2016-08-24: qty 0.4

## 2016-08-24 MED ORDER — INFLUENZA VAC SPLIT QUAD 0.5 ML IM SUSY
0.5000 mL | PREFILLED_SYRINGE | INTRAMUSCULAR | Status: AC
  Administered 2016-08-25: 0.5 mL via INTRAMUSCULAR
  Filled 2016-08-24: qty 0.5

## 2016-08-24 MED ORDER — PRAMIPEXOLE DIHYDROCHLORIDE 1 MG PO TABS
1.0000 mg | ORAL_TABLET | Freq: Every day | ORAL | Status: DC
  Administered 2016-08-24: 1 mg via ORAL
  Filled 2016-08-24: qty 1

## 2016-08-24 MED ORDER — ACETAMINOPHEN 650 MG RE SUPP: 650.0000 mg | Freq: Four times a day (QID) | RECTAL | Status: DC | PRN

## 2016-08-24 MED ORDER — ONDANSETRON HCL 4 MG PO TABS: 4.0000 mg | ORAL_TABLET | Freq: Four times a day (QID) | ORAL | Status: DC | PRN

## 2016-08-24 MED ORDER — METHYLPREDNISOLONE SODIUM SUCC 125 MG IJ SOLR
60.0000 mg | INTRAMUSCULAR | Status: DC
  Administered 2016-08-24: 60 mg via INTRAVENOUS
  Filled 2016-08-24: qty 2

## 2016-08-24 MED ORDER — NICOTINE 7 MG/24HR TD PT24
7.0000 mg | MEDICATED_PATCH | Freq: Every day | TRANSDERMAL | Status: DC
  Administered 2016-08-24 – 2016-08-25 (×2): 7 mg via TRANSDERMAL
  Filled 2016-08-24 (×2): qty 1

## 2016-08-24 MED ORDER — LEVOFLOXACIN IN D5W 500 MG/100ML IV SOLN
500.0000 mg | Freq: Once | INTRAVENOUS | Status: AC
  Administered 2016-08-24: 500 mg via INTRAVENOUS
  Filled 2016-08-24: qty 100

## 2016-08-24 MED ORDER — ASPIRIN 81 MG PO CHEW
324.0000 mg | CHEWABLE_TABLET | Freq: Once | ORAL | Status: AC
  Administered 2016-08-24: 324 mg via ORAL
  Filled 2016-08-24: qty 4

## 2016-08-24 MED ORDER — PNEUMOCOCCAL VAC POLYVALENT 25 MCG/0.5ML IJ INJ: 0.5000 mL | INJECTION | INTRAMUSCULAR | Status: DC

## 2016-08-24 MED ORDER — DUTASTERIDE 0.5 MG PO CAPS
0.5000 mg | ORAL_CAPSULE | Freq: Every day | ORAL | Status: DC
  Administered 2016-08-24 – 2016-08-25 (×2): 0.5 mg via ORAL
  Filled 2016-08-24 (×3): qty 1

## 2016-08-24 MED ORDER — METOPROLOL TARTRATE 25 MG PO TABS
25.0000 mg | ORAL_TABLET | Freq: Two times a day (BID) | ORAL | Status: DC
  Administered 2016-08-24 – 2016-08-25 (×3): 25 mg via ORAL
  Filled 2016-08-24 (×3): qty 1

## 2016-08-24 MED ORDER — SODIUM CHLORIDE 0.9% FLUSH: 3.0000 mL | INTRAVENOUS | Status: DC | PRN

## 2016-08-24 MED ORDER — ALBUTEROL SULFATE (2.5 MG/3ML) 0.083% IN NEBU: 2.5000 mg | INHALATION_SOLUTION | RESPIRATORY_TRACT | Status: DC | PRN

## 2016-08-24 MED ORDER — GUAIFENESIN ER 600 MG PO TB12
600.0000 mg | ORAL_TABLET | Freq: Two times a day (BID) | ORAL | Status: DC
  Administered 2016-08-24 – 2016-08-25 (×2): 600 mg via ORAL
  Filled 2016-08-24 (×2): qty 1

## 2016-08-24 MED ORDER — ALBUTEROL SULFATE (2.5 MG/3ML) 0.083% IN NEBU
2.5000 mg | INHALATION_SOLUTION | RESPIRATORY_TRACT | Status: DC
  Administered 2016-08-24 – 2016-08-25 (×4): 2.5 mg via RESPIRATORY_TRACT
  Filled 2016-08-24 (×5): qty 3

## 2016-08-24 MED ORDER — SODIUM CHLORIDE 0.9 % IV SOLN
INTRAVENOUS | Status: DC
  Administered 2016-08-24 – 2016-08-25 (×2): via INTRAVENOUS

## 2016-08-24 MED ORDER — MEMANTINE HCL ER 14 MG PO CP24
28.0000 mg | ORAL_CAPSULE | Freq: Every day | ORAL | Status: DC
  Administered 2016-08-24: 28 mg via ORAL
  Filled 2016-08-24: qty 2

## 2016-08-24 MED ORDER — LEVOFLOXACIN IN D5W 250 MG/50ML IV SOLN
250.0000 mg | INTRAVENOUS | Status: DC
  Filled 2016-08-24: qty 50

## 2016-08-24 MED ORDER — TIOTROPIUM BROMIDE MONOHYDRATE 18 MCG IN CAPS
18.0000 ug | ORAL_CAPSULE | Freq: Every day | RESPIRATORY_TRACT | Status: DC
  Administered 2016-08-25: 18 ug via RESPIRATORY_TRACT
  Filled 2016-08-24: qty 5

## 2016-08-24 MED ORDER — BUDESONIDE 0.25 MG/2ML IN SUSP
0.2500 mg | Freq: Two times a day (BID) | RESPIRATORY_TRACT | Status: DC
  Administered 2016-08-24 – 2016-08-25 (×3): 0.25 mg via RESPIRATORY_TRACT
  Filled 2016-08-24 (×3): qty 2

## 2016-08-24 MED ORDER — DONEPEZIL HCL 5 MG PO TABS
10.0000 mg | ORAL_TABLET | Freq: Every day | ORAL | Status: DC
  Administered 2016-08-24: 10 mg via ORAL
  Filled 2016-08-24: qty 2

## 2016-08-24 MED ORDER — SODIUM CHLORIDE 0.9 % IV SOLN: 250.0000 mL | INTRAVENOUS | Status: DC | PRN

## 2016-08-24 MED ORDER — ACETAMINOPHEN 325 MG PO TABS: 650.0000 mg | ORAL_TABLET | Freq: Four times a day (QID) | ORAL | Status: DC | PRN

## 2016-08-24 MED ORDER — SODIUM CHLORIDE 0.9% FLUSH: 3.0000 mL | Freq: Two times a day (BID) | INTRAVENOUS | Status: DC

## 2016-08-24 MED ORDER — SODIUM CHLORIDE 0.9 % IV BOLUS (SEPSIS)
1000.0000 mL | Freq: Once | INTRAVENOUS | Status: AC
  Administered 2016-08-24: 1000 mL via INTRAVENOUS

## 2016-08-24 NOTE — ED Provider Notes (Signed)
[facility] Emergency Department Provider Note   ____________________________________________    I have reviewed the triage vital signs and the nursing notes.   HISTORY  Chief Complaint Altered Mental Status   HPI Wayne Le is a 82 y.o. male who presents with altered mental status. Patient apparently lives alone but daughter call several times a day to check on him. Daughter reports she called twice today was unable to reach him. When he finally picked up he was speaking incoherently and he apparently gone to a store but did not know where he was. This is never happened before. 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 No cough fevers chills reported by daughter. No history of stroke. Some decreased memory reported over the last 6 months but that has improved with medication given by PCP. Patient denies motor weakness or change in vision. Last seen normal yesterday evening   Past Medical History:  Diagnosis Date  . Memory changes   . Prostate disorder     There are no active problems to display for this patient.   Past Surgical History:  Procedure Laterality Date  . HERNIA REPAIR      Prior to Admission medications   Not on File     Allergies Review of patient's allergies indicates no known allergies.  No family history on file.  Social History Social History  Substance Use Topics  . Smoking status: Current Every Day Smoker    Packs/day: 0.50  . Smokeless tobacco: Never Used  . Alcohol use No    Review of Systems  Constitutional: No fever/chillsReported  ENT: No neck pain Cardiovascular: Denies chest pain. Respiratory: Mild cough over the last 2 days Gastrointestinal: , no vomiting.   Genitourinary: Negative for dysuria. Musculoskeletal: Negative for back pain. Skin: Negative for rash. Neurological: Negative for  headaches or weakness  10-point ROS otherwise negative.  ____________________________________________   PHYSICAL EXAM:  VITAL SIGNS: ED Triage Vitals  Enc Vitals Group     BP 08/24/16 1119 (!) 154/65     Pulse Rate 08/24/16 1119 96     Resp 08/24/16 1119 18     Temp 08/24/16 1119 99.4 F (37.4 C)     Temp Source 08/24/16 1119 Oral     SpO2 08/24/16 1119 98 %     Weight 08/24/16 1120 134 lb (60.8 kg)     Height 08/24/16 1120 '5\' 8"'$  (1.727 m)     Head Circumference --      Peak Flow --      Pain Score --      Pain Loc --      Pain Edu? --      Excl. in [place]? --     Constitutional: Alert. No acute distress.  Eyes: Conjunctivae are normal.  Head: Atraumatic. Nose: No congestion/rhinnorhea. Mouth/Throat: Mucous membranes are moist.   Neck:  Painless ROM, no vertebral tenderness to palpation Cardiovascular: Normal rate, regular rhythm. Grossly normal heart sounds.  Good peripheral circulation. Respiratory: Normal respiratory effort.  No retractions. Lungs CTAB. Gastrointestinal: Soft and nontender. No distention.  No CVA tenderness. Genitourinary: No groin rash Musculoskeletal: No lower extremity tenderness nor edema.  Warm and well perfused Neurologic:  No focal neuro deficits, normal strength in all extremities, PERRLA, EOMI Skin:  Skin is warm, dry and intact. No rash noted. Psychiatric: Mood and affect are normal. Speech is unusual  and difficult to understand  ____________________________________________   LABS (all labs ordered are listed, but only abnormal results are displayed)  Labs Reviewed  COMPREHENSIVE METABOLIC PANEL - Abnormal; Notable for the following:       Result Value   Glucose, Bld 123 (*)    Creatinine, Ser 1.33 (*)    Total Protein 8.3 (*)    Albumin 3.2 (*)    Total Bilirubin 2.1 (*)    GFR calc non Af Amer 46 (*)    GFR calc Af Amer 54 (*)    All other components within normal limits  CBC - Abnormal; Notable for the following:    MCV 76.9  (*)    MCH 24.4 (*)    MCHC 31.7 (*)    All other components within normal limits  URINALYSIS COMPLETEWITH MICROSCOPIC ([facility] ONLY) - Abnormal; Notable for the following:    Color, Urine AMBER (*)    APPearance HAZY (*)    Hgb urine dipstick 2+ (*)    Protein, ur 30 (*)    Squamous Epithelial / LPF 0-5 (*)    All other components within normal limits  TROPONIN I - Abnormal; Notable for the following:    Troponin I 0.08 (*)    All other components within normal limits  GLUCOSE, CAPILLARY - Abnormal; Notable for the following:    Glucose-Capillary 119 (*)    All other components within normal limits  APTT - Abnormal; Notable for the following:    aPTT 42 (*)    All other components within normal limits  PROTIME-INR - Abnormal; Notable for the following:    Prothrombin Time 15.3 (*)    All other components within normal limits  CULTURE, BLOOD (ROUTINE X 2)  CULTURE, BLOOD (ROUTINE X 2)  URINE CULTURE  URINE DRUG SCREEN, QUALITATIVE ([facility] ONLY)  LACTIC ACID, PLASMA  LACTIC ACID, PLASMA   ____________________________________________  EKG  ED ECG REPORT I, Lavonia Drafts, the attending physician, personally viewed and interpreted this ECG.  Date: 08/24/2016 EKG Time: 12:46 PM Rate: 98 Rhythm: normal sinus rhythm QRS Axis: normal Intervals: normal ST/T Wave abnormalities: normal Conduction Disturbances: PVCs   ____________________________________________  RADIOLOGY  CT head unremarkable, chest x-ray unremarkable ____________________________________________   PROCEDURES  Procedure(s) performed: No    Critical Care performed: No ____________________________________________   INITIAL IMPRESSION / ASSESSMENT AND PLAN / ED COURSE  Pertinent labs & imaging results that were available during my care of the patient were reviewed by me and considered in my medical decision making (see chart for details).  Patient with speech changes and confusion. No evidence of  infection on lab work or chest x-ray, urinalysis pending. CT scan is reassuring however I'm concerned about CVA. He is not in the window for TPA given that he was last seen normal yesterday evening. I'll admit the patient for further workup  Clinical Course  ----------------------------------------- 1:13 PM on 08/24/2016 -----------------------------------------  Nurse rechecked her temperature, rectally and noted the patient to be febrile. Patient also has an elevated troponin, mild, it may be that the patient is septic. We will draw a lactic acid, blood cultures while we await urinalysis ____________________________________________ Urinalysis is unremarkable, I'll admit to the hospital service for further evaluation.  FINAL CLINICAL IMPRESSION(S) / ED DIAGNOSES  Final diagnoses:  Altered mental status, unspecified altered mental status type  Confusion  Fever, unspecified fever cause      NEW MEDICATIONS STARTED DURING THIS VISIT:  New Prescriptions   No medications on file  Note:  This document was prepared using Dragon voice recognition software and may include unintentional dictation errors.    Lavonia Drafts, MD 08/24/16 1356

## 2016-08-24 NOTE — ED Triage Notes (Addendum)
Pt presents with daughter who reports pt having increased confusion and trouble speaking since this morning. Reports that when she saw him yesterday he was at baseline. Daughter reports trying to call pt this morning with no response. When she went to check him, she found him at the store, unaware of where he was. Daughter denies previous episodes of the same behavior. Pt reports having urgency with difficulty voiding. Reports hx of prostate issues.

## 2016-08-24 NOTE — Care Management (Signed)
Spoke with patient's daughter Wayne Le 82 year old) [phone]. She states her father is "always around people"; he is very active; mows peoples yards; he drives". Wayne Le provides some meals. She has a brother in a rest home. A sister that works at [employer] and "not much help with patient". Her baby sister just died of cancer. Wayne Le is the main provider although she is not HCPOA- he does not have HCPOA. PCP Dr. Brynda Greathouse. "He has been showing signs of dementia that Wayne Le talked with Dr. Brynda Greathouse and placed him a medication that is supposed to help it" per [facility]. He is alone most of the time. Wayne Le calls patient daily but "sometimes he don't answer". He was found at a local store sitting in his car by [place]. Wayne Le states she brought him to the hospital. She states that "he will not agree to home health services".  She does not believe that patient will agree to anything but hopes he will go for a little while to rehab. PT evaluation will be needed.

## 2016-08-24 NOTE — H&P (Addendum)
[place] [facility] [place] NAME: Wayne Le    MR#:  [id number]  DATE OF BIRTH:  25-May-1927  DATE OF ADMISSION:  08/24/2016  PRIMARY CARE PHYSICIAN: No PCP Per Patient   REQUESTING/REFERRING PHYSICIAN:   CHIEF COMPLAINT:   Chief Complaint  Patient presents with  . Altered Mental Status    HISTORY OF PRESENT ILLNESS: Wayne Le  is a 82 y.o. male with a known history of Memory changes, prostate hypertrophy, coronary artery disease, status post stents, who presents to the hospital with altered mental status. The patient lives alone, however, today he was noted to be wondering to local store and not know where he was, his speech wasn't coherent, he had difficulty finding words. He was confused. He was brought to emergency room for further evaluation where he was noted to have be febrile with temperature 102.4 rectally. His labs were unremarkable. His chest x-ray was concerning for possible atelectasis. Patient complained of thick gray phlegm. Coughing and wheezing over the past week. It is also concern of possible nausea, vomiting and diarrhea by emergency room physician, however, patient denied that.   PAST MEDICAL HISTORY:   Past Medical History:  Diagnosis Date  . Memory changes   . Prostate disorder     PAST SURGICAL HISTORY: Past Surgical History:  Procedure Laterality Date  . HERNIA REPAIR      SOCIAL HISTORY:  Social History  Substance Use Topics  . Smoking status: Current Every Day Smoker    Packs/day: 0.50  . Smokeless tobacco: Never Used  . Alcohol use No    FAMILY HISTORY: No family history on file.  DRUG ALLERGIES: No Known Allergies  Review of Systems  Constitutional: Negative for chills, fever and weight loss.  HENT: Negative for congestion.   Eyes: Negative for blurred vision and double vision.  Respiratory: Positive for cough, sputum production, shortness of breath and wheezing.   Cardiovascular: Negative  for chest pain, palpitations, orthopnea, leg swelling and PND.  Gastrointestinal: Negative for abdominal pain, blood in stool and constipation.  Genitourinary: Negative for dysuria, frequency, hematuria and urgency.  Musculoskeletal: Positive for myalgias. Negative for falls.  Neurological: Negative for dizziness, tremors, focal weakness and headaches.  Endo/Heme/Allergies: Does not bruise/bleed easily.  Psychiatric/Behavioral: Negative for depression. The patient does not have insomnia.     MEDICATIONS AT HOME:  Prior to Admission medications   Medication Sig Start Date End Date Taking? Authorizing Provider  dutasteride (AVODART) 0.5 MG capsule Take 1 capsule by mouth daily. 08/08/16  Yes Historical Provider, MD  meloxicam (MOBIC) 15 MG tablet Take 1 tablet by mouth daily. 08/03/16  Yes Historical Provider, MD  NAMZARIC 28-10 MG CP24 Take 1 capsule by mouth daily. 07/25/16  Yes Historical Provider, MD      PHYSICAL EXAMINATION:   VITAL SIGNS: Blood pressure 125/69, pulse (!) 40, temperature (!) 102.5 F (39.2 C), resp. rate 20, height '5\' 8"'$  (1.727 m), weight 60.8 kg (134 lb), SpO2 97 %.  GENERAL:  82 y.o.-year-old patient lying in the bed with no acute distress, slurring speech, no lower teeth  EYES: Pupils equal, round, reactive to light and accommodation. No scleral icterus. Extraocular muscles intact.  HEENT: Head atraumatic, normocephalic. Oropharynx and nasopharynx clear.  NECK:  Supple, no jugular venous distention. No thyroid enlargement, no tenderness.  LUNGS: Diminished breath sounds bilaterally, crackles in left base, no wheezing, rales,rhonchi. No use of accessory muscles of respiration.  CARDIOVASCULAR: S1, S2 normal. 3/6 systolic  murmur was heard in aortic auscultation, site, no radiation to neck, no rubs, or gallops. Intermittently Irregular rhythm ABDOMEN: Soft, nontender, nondistended. Bowel sounds present. No organomegaly or mass.  EXTREMITIES: No pedal edema, cyanosis, or  clubbing.  NEUROLOGIC: Cranial nerves II through XII are intact. Muscle strength 5/5 in all extremities. Sensation intact. Gait not checked.  PSYCHIATRIC: The patient is alert and oriented x 2, able to answer some questions and able to follow commands intermittently.  SKIN: No obvious rash, lesion, or ulcer.   LABORATORY PANEL:   CBC  Recent Labs Lab 08/24/16 1135  WBC 9.5  HGB 14.0  HCT 44.3  PLT 216  MCV 76.9*  MCH 24.4*  MCHC 31.7*  RDW 14.5   ------------------------------------------------------------------------------------------------------------------  Chemistries   Recent Labs Lab 08/24/16 1135  NA 136  K 4.4  CL 102  CO2 24  GLUCOSE 123*  BUN 20  CREATININE 1.33*  CALCIUM 9.0  AST 33  ALT 22  ALKPHOS 122  BILITOT 2.1*   ------------------------------------------------------------------------------------------------------------------  Cardiac Enzymes  Recent Labs Lab 08/24/16 1135  TROPONINI 0.08*   ------------------------------------------------------------------------------------------------------------------  RADIOLOGY: Ct Head Wo Contrast  Result Date: 08/24/2016 CLINICAL DATA:  Altered mental status EXAM: CT HEAD WITHOUT CONTRAST TECHNIQUE: Contiguous axial images were obtained from the base of the skull through the vertex without intravenous contrast. COMPARISON:  None. FINDINGS: Brain: There is atrophy and chronic small vessel disease changes. No acute intracranial abnormality. Specifically, no hemorrhage, hydrocephalus, mass lesion, acute infarction, or significant intracranial injury. Vascular: No hyperdense vessel or unexpected calcification. Skull: No acute calvarial abnormality. Sinuses/Orbits: Visualized paranasal sinuses and mastoids clear. Orbital soft tissues unremarkable. Other: None IMPRESSION: No acute intracranial abnormality. Atrophy, chronic microvascular disease. Electronically Signed   By: Rolm Baptise M.D.   On: 08/24/2016  12:32   Dg Chest Portable 1 View  Result Date: 08/24/2016 CLINICAL DATA:  Increasing confusion, cough, difficulty speaking, difficulty urinating EXAM: PORTABLE CHEST 1 VIEW COMPARISON:  Chest x-ray of 04/07/2014 and CT chest of the same date FINDINGS: The lungs are not well aerated with basilar atelectasis present. No pneumonia or effusion is seen. The heart is mildly enlarged and stable. IMPRESSION: 1. Poor inspiration with bibasilar atelectasis. 2. No definite pneumonia or effusion. 3. Stable cardiomegaly. Electronically Signed   By: Ivar Drape M.D.   On: 08/24/2016 12:04    EKG: Orders placed or performed during the hospital encounter of 08/24/16  . ED EKG  . ED EKG  EKG reveals sinus rhythm at 98 bpm, multiple premature complexes, ventricular and supraventricular, probable anteroseptal infarct with poor R-wave progression in V3, no acute ST-T changes  IMPRESSION AND PLAN:  Principal Problem:   Sepsis ([place]) Active Problems:   Acute gastroenteritis   Community acquired pneumonia   Acute renal insufficiency   Hyperbilirubinemia   Elevated troponin #1. Sepsis due to community acquired pneumonia, admitted to medical floor, get sputum cultures, blood cultures, initiate him on levofloxacin, follow clinically #2. COPD exacerbation due to pneumonia, initiate patient on steroids, nebulizing therapy, inhalers, oxygen if needed #3. Acute renal insufficiency, continue IV fluids, follow creatinine in the morning #4. Hyperbilirubinemia, check direct bilirubin #5. Metabolic encephalopathy, likely delirium due to fever, sepsis, get ammonia level checked, supportive therapy #6. Elevated troponin, likely demand ischemia, get echocardiogram #7 tobacco abuse. Counseling, discussed this patient for approximately 3 minutes, nicotine replacement therapy will be initiated #8. Hyperglycemia, get hemoglobin A1c to rule out diabetes #9. Restless leg syndrome, initiate Mirapex  All the records are reviewed  and case discussed with ED provider. Management plans discussed with the patient, family and they are in agreement.  CODE STATUS: Code Status History    This patient does not have a recorded code status. Please follow your organizational policy for patients in this situation.       TOTAL TIME TAKING CARE OF THIS PATIENT: 60 minutes.   Discussed this patient's daughter Theodoro Grist M.D on 08/24/2016 at 2:26 PM  Between 7am to 6pm - Pager - [phone] After 6pm go to [email or web address] - password [email or web address] [facility]  [place] [facility]  Office  [phone]  CC: Primary care physician; No PCP Per Patient

## 2016-08-24 NOTE — ED Notes (Signed)
Code Sepsis called to carelink, [place] at [employer]

## 2016-08-25 LAB — COMPREHENSIVE METABOLIC PANEL
ALBUMIN: 2.4 g/dL — AB (ref 3.5–5.0)
ALT: 24 U/L (ref 17–63)
ANION GAP: 7 (ref 5–15)
AST: 37 U/L (ref 15–41)
Alkaline Phosphatase: 73 U/L (ref 38–126)
BUN: 20 mg/dL (ref 6–20)
CALCIUM: 8.3 mg/dL — AB (ref 8.9–10.3)
CHLORIDE: 108 mmol/L (ref 101–111)
CO2: 21 mmol/L — AB (ref 22–32)
Creatinine, Ser: 0.89 mg/dL (ref 0.61–1.24)
GFR calc non Af Amer: >60 mL/min (ref >60)
GLUCOSE: 148 mg/dL — AB (ref 65–99)
POTASSIUM: 4.8 mmol/L (ref 3.5–5.1)
SODIUM: 136 mmol/L (ref 135–145)
Total Bilirubin: 0.8 mg/dL (ref 0.3–1.2)
Total Protein: 6.4 g/dL — ABNORMAL LOW (ref 6.5–8.1)

## 2016-08-25 LAB — CBC
HEMATOCRIT: 34.5 % — AB (ref 40.0–52.0)
HEMOGLOBIN: 11.2 g/dL — AB (ref 13.0–18.0)
MCH: 24.5 pg — AB (ref 26.0–34.0)
MCHC: 32.4 g/dL (ref 32.0–36.0)
MCV: 75.4 fL — ABNORMAL LOW (ref 80.0–100.0)
Platelets: 176 10*3/uL (ref 150–440)
RBC: 4.57 MIL/uL (ref 4.40–5.90)
RDW: 14.2 % (ref 11.5–14.5)
WBC: 7.6 10*3/uL (ref 3.8–10.6)

## 2016-08-25 LAB — HEMOGLOBIN A1C
HEMOGLOBIN A1C: 6 % — AB (ref 4.8–5.6)
MEAN PLASMA GLUCOSE: 126 mg/dL

## 2016-08-25 LAB — URINE CULTURE: CULTURE: NO GROWTH

## 2016-08-25 LAB — ECHOCARDIOGRAM COMPLETE
HEIGHTINCHES: 68 in
WEIGHTICAEL: 2144 [oz_av]

## 2016-08-25 LAB — TROPONIN I

## 2016-08-25 LAB — MAGNESIUM: Magnesium: 2.1 mg/dL (ref 1.7–2.4)

## 2016-08-25 MED ORDER — LEVOFLOXACIN 500 MG PO TABS
250.0000 mg | ORAL_TABLET | Freq: Every day | ORAL | Status: DC
  Administered 2016-08-25: 250 mg via ORAL
  Filled 2016-08-25: qty 1

## 2016-08-25 MED ORDER — LEVOFLOXACIN 250 MG PO TABS: 250.0000 mg | ORAL_TABLET | Freq: Every day | ORAL | 0 refills | Status: DC

## 2016-08-25 NOTE — NC FL2 (Signed)
[place] LEVEL OF CARE SCREENING TOOL     IDENTIFICATION  Patient Name: Wayne Le Birthdate: 1927-03-21 Sex: male Admission Date (Current Location): 08/24/2016  [place] and [place] Number:  Engineering geologist and Address:  [facility], [address] [place], [place] [address]      Provider Number: [id number]  Attending Physician Name and Address:  Fritzi Mandes, MD  Relative Name and Phone Number:       Current Level of Care: SNF Recommended Level of Care: [place] Prior Approval Number:    Date Approved/Denied:   PASRR Number:  [id number])  Discharge Plan: SNF    Current Diagnoses: Patient Active Problem List   Diagnosis Date Noted  . Sepsis ([place]) 08/24/2016  . Acute gastroenteritis 08/24/2016  . Community acquired pneumonia 08/24/2016  . Acute renal insufficiency 08/24/2016  . Hyperbilirubinemia 08/24/2016  . Elevated troponin 08/24/2016    Orientation RESPIRATION BLADDER Height & Weight     Self, Time, Situation, Place  Normal Continent Weight: 134 lb (60.8 kg) Height:  '5\' 8"'$  (172.7 cm)  BEHAVIORAL SYMPTOMS/MOOD NEUROLOGICAL BOWEL NUTRITION STATUS   (None.)  (None.) Continent Diet (Diet: DYS 2)  AMBULATORY STATUS COMMUNICATION OF NEEDS Skin   Extensive Assist Verbally Normal                       Personal Care Assistance Level of Assistance  Bathing, Feeding, Dressing Bathing Assistance: Limited assistance Feeding assistance: Independent Dressing Assistance: Limited assistance     Functional Limitations Info  Sight, Hearing, Speech Sight Info: Adequate Hearing Info: Adequate Speech Info: Adequate    SPECIAL CARE FACTORS FREQUENCY  PT (By licensed PT), OT (By licensed OT)     PT Frequency:  (5) OT Frequency:  (5)            Contractures      Additional Factors Info  Code Status, Allergies Code Status Info:  (Full Code) Allergies Info:  (Penicillins)            Current Medications (08/25/2016):  This is the current hospital active medication list Current Facility-Administered Medications  Medication Dose Route Frequency Provider Last Rate Last Dose  . 0.9 %  sodium chloride infusion  250 mL Intravenous PRN Theodoro Grist, MD      . 0.9 %  sodium chloride infusion   Intravenous Continuous Theodoro Grist, MD 75 mL/hr at 08/25/16 0347    . acetaminophen (TYLENOL) tablet 650 mg  650 mg Oral Q6H PRN Theodoro Grist, MD       Or  . acetaminophen (TYLENOL) suppository 650 mg  650 mg Rectal Q6H PRN Theodoro Grist, MD      . albuterol (PROVENTIL) (2.5 MG/3ML) 0.083% nebulizer solution 2.5 mg  2.5 mg Nebulization Q4H Theodoro Grist, MD   2.5 mg at 08/25/16 0816  . albuterol (PROVENTIL) (2.5 MG/3ML) 0.083% nebulizer solution 2.5 mg  2.5 mg Nebulization Q2H PRN Theodoro Grist, MD      . budesonide (PULMICORT) nebulizer solution 0.25 mg  0.25 mg Nebulization BID Theodoro Grist, MD   0.25 mg at 08/25/16 0816  . memantine (NAMENDA XR) 24 hr capsule 28 mg  28 mg Oral QHS Theodoro Grist, MD   28 mg at 08/24/16 2204   And  . donepezil (ARICEPT) tablet 10 mg  10 mg Oral QHS Theodoro Grist, MD   10 mg at 08/24/16 2204  . dutasteride (AVODART) capsule 0.5 mg  0.5 mg Oral Daily  Theodoro Grist, MD   0.5 mg at 08/25/16 0947  . enoxaparin (LOVENOX) injection 40 mg  40 mg Subcutaneous Q24H Theodoro Grist, MD   40 mg at 08/24/16 2202  . guaiFENesin (MUCINEX) 12 hr tablet 600 mg  600 mg Oral BID Theodoro Grist, MD   600 mg at 08/25/16 0947  . Levofloxacin (LEVAQUIN) IVPB 250 mg  250 mg Intravenous Q24H Theodoro Grist, MD      . methylPREDNISolone sodium succinate (SOLU-MEDROL) 125 mg/2 mL injection 60 mg  60 mg Intravenous Q24H Theodoro Grist, MD   60 mg at 08/24/16 1519  . metoprolol tartrate (LOPRESSOR) tablet 25 mg  25 mg Oral BID Theodoro Grist, MD   25 mg at 08/25/16 0947  . nicotine (NICODERM CQ - dosed in mg/24 hr) patch 7 mg  7 mg Transdermal Daily Theodoro Grist, MD   7 mg at 08/25/16  0956  . nitroGLYCERIN (NITROGLYN) 2 % ointment 1 inch  1 inch Topical Q6H Theodoro Grist, MD   1 inch at 08/25/16 0508  . ondansetron (ZOFRAN) tablet 4 mg  4 mg Oral Q6H PRN Theodoro Grist, MD       Or  . ondansetron (ZOFRAN) injection 4 mg  4 mg Intravenous Q6H PRN Theodoro Grist, MD      . pneumococcal 23 valent vaccine (PNU-IMMUNE) injection 0.5 mL  0.5 mL Intramuscular Tomorrow-1000 Theodoro Grist, MD      . pramipexole (MIRAPEX) tablet 1 mg  1 mg Oral QHS Theodoro Grist, MD   1 mg at 08/24/16 2204  . sodium chloride flush (NS) 0.9 % injection 3 mL  3 mL Intravenous Q12H Theodoro Grist, MD      . sodium chloride flush (NS) 0.9 % injection 3 mL  3 mL Intravenous Q12H Theodoro Grist, MD      . sodium chloride flush (NS) 0.9 % injection 3 mL  3 mL Intravenous PRN Theodoro Grist, MD      . tiotropium (SPIRIVA) inhalation capsule 18 mcg  18 mcg Inhalation Daily Theodoro Grist, MD   18 mcg at 08/25/16 5670     Discharge Medications: Please see discharge summary for a list of discharge medications.  Relevant Imaging Results:  Relevant Lab Results:   Additional Information  (SSN: [id number])  Danie Chandler, Student-Social Work

## 2016-08-25 NOTE — Evaluation (Signed)
Physical Therapy Evaluation Patient Details Name: Wayne Le MRN: [id number] DOB: May 23, 1927 Today's Date: 08/25/2016   History of Present Illness  Pt. brought to ED with onset of confusion, altered mental status. Found to be febrile admitted for sepsis, pneumonia, acute gastroenteritis, elevated troponin (attributed to demand ischemia), hyperbilirubinemia, acute renal insuffiency. imaging negative for cranial injur/infarct/hemhorrhage.   Clinical Impression  Pt. Supine in bed upon arrival, demonstrates alertness and orientationx4 however, was 0/3 for 3 word recall short term memory task(55mns) during session. Pt. Demonstrates grossly 5/5 strength BUE and BLE symmetrical. Pt. Able to perform bed mobility mod I, with use of bedrails to assist. He was able to perform sit<>stand transfers supervision with Rw demonstrating safe technique without cueing. Pt. Performed approx. 60 ft. Of ambulation minA with SPC, demonstrating increased sway, even step length/step through pattern, demonstrated one LOB but was able to self recover. Pt. Unable to perform full range multi-directional head turns during ambulation likely in effort to maintain stability within BOS as he does not demonstrate cervical AROM deficits. Unsafe to attempt ambulation without use of AD at this time which was pt's previous level of mobility.  He performed an additional 1790f Of gait supervision with use of RW demonstrating decreased sway, steady cadence, even step length/step through pattern.  Pt. Able to ascend/descend 4 steps using B railings with CGA demonstrating reciprocal pattern. Pt. reports he feels he is at his baseline level of mobility and is ready to return to working however, he does not use AD at baseline and would be unsafe to perform mobility without RW at this time. Pt. Performance on short term word recall (0/3) is concerning when considering that if pt is unable to recall use of recommended AD (RW) he would certainly be  at risk for falls. Recommend use of RW for all mobility at this time. Would benefit from skilled PT to address above deficits and promote optimal return to PLOF. Recommend SNF placement upon d/c to follow up with further PT needs.     Follow Up Recommendations SNF    Equipment Recommendations  Rolling walker with 5" wheels    Recommendations for Other Services       Precautions / Restrictions Precautions Precautions: Fall Restrictions Weight Bearing Restrictions: No      Mobility  Bed Mobility Overal bed mobility: Modified Independent             General bed mobility comments: HOB elevated and use of bedrails   Transfers Overall transfer level: Needs assistance Equipment used: Rolling walker (2 wheeled) Transfers: Sit to/from Stand Sit to Stand: Supervision         General transfer comment: Pt. able to perform sit<>stand transfers with safe technique without cueing.   Ambulation/Gait Ambulation/Gait assistance: Min assist Ambulation Distance (Feet): 60 Feet Assistive device: Straight cane       General Gait Details: Pt. demonstrates slowed cadence, increased sway, even step length step through pattern, one LOB able to self correct. Unable to perform full ROM head turns in effort to maintain his stability. Unsafe to attempt ambulation without AD at this time which was pt's previous baseline ambulation ability   Stairs Stairs: Yes Stairs assistance: Min guard Stair Management: Two rails Number of Stairs: 4 General stair comments: Pt. able to ascend/descend stairs with reciprocal pattern using B railing for UE support   Wheelchair Mobility    Modified Rankin (Stroke Patients Only)       Balance Overall balance assessment: Needs assistance Sitting-balance support: Feet  supported Sitting balance-Leahy Scale: Good     Standing balance support: Single extremity supported;Bilateral upper extremity supported Standing balance-Leahy Scale: Poor Standing  balance comment: Pt. demonstrates improved standing balance with BUE support. Demonstrates LOB with single UE support.              High level balance activites: Head turns;Sudden stops High Level Balance Comments: when cued pt. able to perform slight multi-directional head turns, does not perform full range heads while ambulating likely in effort to maintain stability within BOS, able to demonstrate sudden stop without LOB, changes in gait speed are minimal when cued again liklely in an effort to maintain stability.               Pertinent Vitals/Pain Pain Assessment: No/denies pain    Home Living Family/patient expects to be discharged to:: Private residence Living Arrangements: Alone Available Help at Discharge: Family (pt. states duaghters are able to help him, however due to employment would likely not be available 24/7) Type of Home: House Home Access: Stairs to enter Entrance Stairs-Rails: Can reach both Entrance Stairs-Number of Steps: 4 Home Layout: One level Home Equipment: [place] - single point Additional Comments: pt. reports he owns [facility] but seldom uses it.     Prior Function Level of Independence: Independent         Comments: Pt. performs landscaping and roofing work intermittently, independent for all ADL's, driving     Hand Dominance        Extremity/Trunk Assessment   Upper Extremity Assessment: Overall WFL for tasks assessed           Lower Extremity Assessment: Overall WFL for tasks assessed         Communication   Communication: No difficulties  Cognition Arousal/Alertness: Awake/alert Behavior During Therapy: WFL for tasks assessed/performed Overall Cognitive Status: History of cognitive impairments - at baseline (per chart review, family has been reporting signs of dementia over the past few months)       Memory: Decreased short-term memory (Pt. unable to recall 3 word task when asked to repeat about 83mns later)               General Comments      Exercises Other Exercises Other Exercises: Pt. performed approx. 1782f of gait with use of RW supervision, demonstrates steady cadence, decreased sway [facility] step length/step through pattern, no LOB or evidence of buckling.    Assessment/Plan    PT Assessment Patient needs continued PT services  PT Problem List Decreased balance;Decreased knowledge of use of DME;Decreased safety awareness          PT Treatment Interventions Functional mobility training;Gait training;Therapeutic activities;DME instruction;Therapeutic exercise;Stair training;Balance training;Patient/family education    PT Goals (Current goals can be found in the Care Plan section)  Acute Rehab PT Goals Patient Stated Goal: Pt. would like to keep working PT Goal Formulation: With patient Time For Goal Achievement: 09/08/16 Potential to Achieve Goals: Fair    Frequency Min 2X/week   Barriers to discharge Decreased caregiver support Pt. lives alone     Co-evaluation               End of Session Equipment Utilized During Treatment: Gait belt Activity Tolerance: Patient tolerated treatment well Patient left: in chair;with chair alarm set;with call bell/phone within reach           Time: 1036-1103 PT Time Calculation (min) (ACUTE ONLY): 27 min   Charges:  PT G Codes:         Melanie Crazier, SPT  08/25/16,1:33 PM

## 2016-08-25 NOTE — Discharge Summary (Signed)
[employer] [facility] [place] NAME: Wayne Le    MR#:  [id number]  DATE OF BIRTH:  07/25/1927  DATE OF ADMISSION:  08/24/2016 ADMITTING PHYSICIAN: Theodoro Grist, MD  DATE OF DISCHARGE: 08/25/16  PRIMARY CARE PHYSICIAN: Marden Noble, MD    ADMISSION DIAGNOSIS:  Confusion [R41.0] Fever, unspecified fever cause [R50.9] Altered mental status, unspecified altered mental status type [R41.82]  DISCHARGE DIAGNOSIS:  Febrile illness-resolved-appears viral -acute confusion-resolved Acute renal failure-resolved dementia  SECONDARY DIAGNOSIS:   Past Medical History:  Diagnosis Date  . Memory changes   . Prostate disorder     HOSPITAL COURSE:  Wayne Le  is a 82 y.o. male with a known history of Memory changes, prostate hypertrophy, coronary artery disease, status post stents, who presents to the hospital with altered mental status.   #1. Febrile illness appears viral -ID w/u so far negative -no high grade fever here -wbc normal -cxr no pna, UA looks ok, BC negative -empiric levaquin  #2. COPD exacerbation resolved -lungs sound clear -sats >92% on RA  #3. Acute renal insufficiency -received  IV fluids, follow up creatinine norma;  #4 tobacco abuse. Counseling, discussed this patient for approximately 3 minutes, nicotine replacement therapy will be initiated  #5. H/o memory loss and early dementia Cont home meds  Spoke with dter [name]. Pt is requesting to go home. Ambulated well with RN. HHRN to be arranged   CONSULTS OBTAINED:    DRUG ALLERGIES:   Allergies  Allergen Reactions  . Penicillins Hives and Rash    DISCHARGE MEDICATIONS:   Current Discharge Medication List    START taking these medications   Details  levofloxacin (LEVAQUIN) 250 MG tablet Take 1 tablet (250 mg total) by mouth daily. Qty: 4 tablet, Refills: 0      CONTINUE these medications which have NOT CHANGED   Details  dutasteride  (AVODART) 0.5 MG capsule Take 1 capsule by mouth daily.    meloxicam (MOBIC) 15 MG tablet Take 1 tablet by mouth daily.    NAMZARIC 28-10 MG CP24 Take 1 capsule by mouth daily.        If you experience worsening of your admission symptoms, develop shortness of breath, life threatening emergency, suicidal or homicidal thoughts you must seek medical attention immediately by calling 911 or calling your MD immediately  if symptoms less severe.  You Must read complete instructions/literature along with all the possible adverse reactions/side effects for all the Medicines you take and that have been prescribed to you. Take any new Medicines after you have completely understood and accept all the possible adverse reactions/side effects.   Please note  You were cared for by a hospitalist during your hospital stay. If you have any questions about your discharge medications or the care you received while you were in the hospital after you are discharged, you can call the unit and asked to speak with the hospitalist on call if the hospitalist that took care of you is not available. Once you are discharged, your primary care physician will handle any further medical issues. Please note that NO REFILLS for any discharge medications will be authorized once you are discharged, as it is imperative that you return to your primary care physician (or establish a relationship with a primary care physician if you do not have one) for your aftercare needs so that they can reassess your need for medications and monitor your lab values. Today   SUBJECTIVE  I want to go home VITAL SIGNS:  Blood pressure (!) 146/68, pulse 60, temperature 97.5 F (36.4 C), temperature source Oral, resp. rate 20, height '5\' 8"'$  (1.727 m), weight 60.8 kg (134 lb), SpO2 98 %.  I/O:   Intake/Output Summary (Last 24 hours) at 08/25/16 1212 Last data filed at 08/25/16 1200  Gross per 24 hour  Intake             2695 ml  Output               550 ml  Net             2145 ml    PHYSICAL EXAMINATION:  GENERAL:  82 y.o.-year-old patient lying in the bed with no acute distress.  EYES: Pupils equal, round, reactive to light and accommodation. No scleral icterus. Extraocular muscles intact.  HEENT: Head atraumatic, normocephalic. Oropharynx and nasopharynx clear.  NECK:  Supple, no jugular venous distention. No thyroid enlargement, no tenderness.  LUNGS: Normal breath sounds bilaterally, no wheezing, rales,rhonchi or crepitation. No use of accessory muscles of respiration.  CARDIOVASCULAR: S1, S2 normal. No murmurs, rubs, or gallops.  ABDOMEN: Soft, non-tender, non-distended. Bowel sounds present. No organomegaly or mass.  EXTREMITIES: No pedal edema, cyanosis, or clubbing.  NEUROLOGIC: Cranial nerves II through XII are intact. Muscle strength 5/5 in all extremities. Sensation intact. Gait not checked.  PSYCHIATRIC: The patient is alert and oriented x 3.  SKIN: No obvious rash, lesion, or ulcer.   DATA REVIEW:   CBC   Recent Labs Lab 08/25/16 0455  WBC 7.6  HGB 11.2*  HCT 34.5*  PLT 176    Chemistries   Recent Labs Lab 08/25/16 0023 08/25/16 0455  NA  --  136  K  --  4.8  CL  --  108  CO2  --  21*  GLUCOSE  --  148*  BUN  --  20  CREATININE  --  0.89  CALCIUM  --  8.3*  MG 2.1  --   AST  --  37  ALT  --  24  ALKPHOS  --  8  BILITOT  --  0.8    Microbiology Results   Recent Results (from the past 240 hour(s))  Blood Culture (routine x 2)     Status: None (Preliminary result)   Collection Time: 08/24/16  1:20 PM  Result Value Ref Range Status   Specimen Description BLOOD LEFT AC  Final   Special Requests BOTTLES DRAWN AEROBIC AND ANAEROBIC 6CC  Final   Culture NO GROWTH < 24 HOURS  Final   Report Status PENDING  Incomplete  Blood Culture (routine x 2)     Status: None (Preliminary result)   Collection Time: 08/24/16  1:43 PM  Result Value Ref Range Status   Specimen Description BLOOD RIGHT  FOREARM  Final   Special Requests   Final    BOTTLES DRAWN AEROBIC AND ANAEROBIC AERO 8CC ANA [place]   Culture NO GROWTH < 24 HOURS  Final   Report Status PENDING  Incomplete    RADIOLOGY:  Ct Head Wo Contrast  Result Date: 08/24/2016 CLINICAL DATA:  Altered mental status EXAM: CT HEAD WITHOUT CONTRAST TECHNIQUE: Contiguous axial images were obtained from the base of the skull through the vertex without intravenous contrast. COMPARISON:  None. FINDINGS: Brain: There is atrophy and chronic small vessel disease changes. No acute intracranial abnormality. Specifically, no hemorrhage, hydrocephalus, mass lesion, acute infarction, or significant intracranial injury. Vascular: No hyperdense  vessel or unexpected calcification. Skull: No acute calvarial abnormality. Sinuses/Orbits: Visualized paranasal sinuses and mastoids clear. Orbital soft tissues unremarkable. Other: None IMPRESSION: No acute intracranial abnormality. Atrophy, chronic microvascular disease. Electronically Signed   By: Rolm Baptise M.D.   On: 08/24/2016 12:32   Dg Chest Portable 1 View  Result Date: 08/24/2016 CLINICAL DATA:  Increasing confusion, cough, difficulty speaking, difficulty urinating EXAM: PORTABLE CHEST 1 VIEW COMPARISON:  Chest x-ray of 04/07/2014 and CT chest of the same date FINDINGS: The lungs are not well aerated with basilar atelectasis present. No pneumonia or effusion is seen. The heart is mildly enlarged and stable. IMPRESSION: 1. Poor inspiration with bibasilar atelectasis. 2. No definite pneumonia or effusion. 3. Stable cardiomegaly. Electronically Signed   By: Ivar Drape M.D.   On: 08/24/2016 12:04     Management plans discussed with the patient, family and they are in agreement.  CODE STATUS:     Code Status Orders        Start     Ordered   08/24/16 1615  Full code  Continuous     08/24/16 1614    Code Status History    Date Active Date Inactive Code Status Order ID Comments User Context   This  patient has a current code status but no historical code status.      TOTAL TIME TAKING CARE OF THIS PATIENT: 40 minutes.    Finnegan Gatta M.D on 08/25/2016 at 12:12 PM  Between 7am to 6pm - Pager - [phone] After 6pm go to [email or web address] - password [email or web address] [facility]  Office  [phone]  CC: Primary care physician; Marden Noble, MD

## 2016-08-25 NOTE — Care Management (Signed)
Spoke with patient in the presence of MD. He agrees to home health nurse and Education officer, [employer]. He did not have preference from list of agencies provided. [facility] for RN, PT and SW. [name] can pick patient up today after 4PM. She requests rolling walker and bedside commode to be delivered to this room prior to discharge. Referral to [facility].

## 2016-08-25 NOTE — Clinical Social Work Note (Signed)
Clinical Social Work Assessment  Patient Details  Name: Wayne Le MRN: [id number] Date of Birth: [birth year]  Date of referral:  08/25/16               Reason for consult:  Discharge Planning, Facility Placement                Permission sought to share information with:  Case Manager Permission granted to share information::  Yes, Verbal Permission Granted  Name::        Agency::     Relationship::     Contact Information:     Housing/Transportation Living arrangements for the past 2 months:  Single Family Home Source of Information:  Patient Patient Interpreter Needed:  None Criminal Activity/Legal Involvement Pertinent to Current Situation/Hospitalization:  No - Comment as needed Significant Relationships:  Adult Children, Neighbor, Friend Lives with:  Self Do you feel safe going back to the place where you live?  Yes Need for family participation in patient care:  Yes (Comment)  Care giving concerns:  Patient lives alone in [place] and is independent with all his ADLs.    Social Worker assessment / plan:  Holiday representative (CSW) reviewed chart and noted that patient lives alone and PT is pending. CSW met with patient alone at bedside. Patient was alert and oriented and was sitting up in the bed combing his hair. CSW introduced self and explained role of CSW department. Per patient he lives alone and [name] checks on him often. Patient reported that [name] calls every day and lives near him. 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 CSW explained that PT will work with patient and make a recommendation of home health or SNF. Patient reported that his plan is to go home. RN case manager aware of above. CSW will continue to follow and assist as needed.    Employment status:  Contractor PT Recommendations:  Not assessed at this time Information /  Referral to community resources:  Other (Comment Required) (Patient is declining SNF and wants to go home with home health. )  Patient/Family's Response to care: Patient plans on returning home.   Patient/Family's Understanding of and Emotional Response to Diagnosis, Current Treatment, and Prognosis:  Patient was pleasant and thanked CSW for visit.   Emotional Assessment Appearance:  Appears stated age Attitude/Demeanor/Rapport:    Affect (typically observed):  Accepting, Adaptable, Pleasant Orientation:  Oriented to Self, Oriented to Place, Oriented to  Time, Oriented to Situation Alcohol / Substance use:  Not Applicable Psych involvement (Current and /or in the community):  No (Comment)  Discharge Needs  Concerns to be addressed:  Discharge Planning Concerns Readmission within the last 30 days:  No Current discharge risk:  Chronically ill Barriers to Discharge:  Continued Medical Work up   [employer], Veronia Beets, LCSW 08/25/2016, 10:35 AM

## 2016-08-29 LAB — CULTURE, BLOOD (ROUTINE X 2)
CULTURE: NO GROWTH
CULTURE: NO GROWTH

## 2016-09-05 NOTE — Care Management (Signed)
Post discharge note: Received notification from [facility] that patient would not allow home health to start care with him. I spoke with daughter [name] and she states that "home health nurse said he didn't qualify because he still drives". [name] does not feel that patient is safe to be out driving. This RNCM advised her to talk with PCP Dr Brynda Greathouse about this concern- she agreed. Case closed.

## 2016-09-05 NOTE — Progress Notes (Addendum)
[facility]  Patient Status: Closed, patient has been contacted since 10/6 numerous times, hung up each time we have reached him. Contacted daughter to try and establish services and she said she would contact him and let him know that we were trying to reach him still no response when calling. Nurse did finally get in touch with patient and found out that patient was not homebound and still driving daily. Notified Marshell Garfinkel, CM.   Florene Glen 09/05/2016, 9:34 AM

## 2016-12-12 ENCOUNTER — Emergency Department
Admission: EM | Admit: 2016-12-12 | Discharge: 2016-12-12 | Disposition: A | Discharge status: Home / Self Care | Payer: Medicare HMO | Attending: Emergency Medicine | Admitting: Emergency Medicine

## 2016-12-12 ENCOUNTER — Encounter: Payer: Self-pay | Admitting: Emergency Medicine

## 2016-12-12 ENCOUNTER — Emergency Department: Payer: Medicare HMO

## 2016-12-12 DIAGNOSIS — Z791 Long term (current) use of non-steroidal anti-inflammatories (NSAID): Secondary | ICD-10-CM | POA: Insufficient documentation

## 2016-12-12 DIAGNOSIS — E860 Dehydration: Secondary | ICD-10-CM | POA: Diagnosis not present

## 2016-12-12 DIAGNOSIS — F17200 Nicotine dependence, unspecified, uncomplicated: Secondary | ICD-10-CM | POA: Insufficient documentation

## 2016-12-12 DIAGNOSIS — K5900 Constipation, unspecified: Secondary | ICD-10-CM | POA: Diagnosis present

## 2016-12-12 LAB — COMPREHENSIVE METABOLIC PANEL
ALK PHOS: 93 U/L (ref 38–126)
ALT: 38 U/L (ref 17–63)
AST: 50 U/L — AB (ref 15–41)
Albumin: 3.6 g/dL (ref 3.5–5.0)
Anion gap: 8 (ref 5–15)
BILIRUBIN TOTAL: 0.7 mg/dL (ref 0.3–1.2)
BUN: 25 mg/dL — AB (ref 6–20)
CALCIUM: 9 mg/dL (ref 8.9–10.3)
CO2: 23 mmol/L (ref 22–32)
CREATININE: 1.35 mg/dL — AB (ref 0.61–1.24)
Chloride: 102 mmol/L (ref 101–111)
GFR, EST AFRICAN AMERICAN: 53 mL/min — AB (ref >60)
GFR, EST NON AFRICAN AMERICAN: 46 mL/min — AB (ref >60)
Glucose, Bld: 106 mg/dL — ABNORMAL HIGH (ref 65–99)
Potassium: 4.6 mmol/L (ref 3.5–5.1)
Sodium: 133 mmol/L — ABNORMAL LOW (ref 135–145)
Total Protein: 8.5 g/dL — ABNORMAL HIGH (ref 6.5–8.1)

## 2016-12-12 LAB — CBC
HCT: 46.2 % (ref 40.0–52.0)
Hemoglobin: 15.1 g/dL (ref 13.0–18.0)
MCH: 24.3 pg — ABNORMAL LOW (ref 26.0–34.0)
MCHC: 32.7 g/dL (ref 32.0–36.0)
MCV: 74.4 fL — AB (ref 80.0–100.0)
PLATELETS: 174 10*3/uL (ref 150–440)
RBC: 6.21 MIL/uL — ABNORMAL HIGH (ref 4.40–5.90)
RDW: 14.7 % — ABNORMAL HIGH (ref 11.5–14.5)
WBC: 5.1 10*3/uL (ref 3.8–10.6)

## 2016-12-12 LAB — URINALYSIS, COMPLETE (UACMP) WITH MICROSCOPIC
BILIRUBIN URINE: NEGATIVE
Bacteria, UA: NONE SEEN
Glucose, UA: NEGATIVE mg/dL
Hgb urine dipstick: NEGATIVE
Ketones, ur: NEGATIVE mg/dL
LEUKOCYTES UA: NEGATIVE
Nitrite: NEGATIVE
PH: 5 (ref 5.0–8.0)
Protein, ur: 30 mg/dL — AB
Specific Gravity, Urine: 1.024 (ref 1.005–1.030)

## 2016-12-12 LAB — TROPONIN I: Troponin I: <0.03 ng/mL (ref <0.03)

## 2016-12-12 LAB — LIPASE, BLOOD: Lipase: 166 U/L — ABNORMAL HIGH (ref 11–51)

## 2016-12-12 MED ORDER — SODIUM CHLORIDE 0.9 % IV BOLUS (SEPSIS)
1000.0000 mL | Freq: Once | INTRAVENOUS | Status: AC
  Administered 2016-12-12: 1000 mL via INTRAVENOUS

## 2016-12-12 NOTE — ED Provider Notes (Addendum)
[facility] Emergency Department Provider Note  ____________________________________________   First MD Initiated Contact with Patient 12/12/16 1623     (approximate)  I have reviewed the triage vital signs and the nursing notes.   HISTORY  Chief Complaint Constipation   HPI Wayne Le is a 83 y.o. male with a history of renal insufficiency who is presenting to the emergency department today with constipation. He says that he is only moved his bowels twice over the past week. He says that yesterday he had a very small bowel movement. However, he also says that he has been eating over the past week. He says that he has decreased appetite and does not know why. However, he says these drinking water and chocolate milk. Says that he has had 2 hamburgers this week but that is it. He says that he intermittently has epigastric abdominal pain which has been going on for weeks to months. Denies any abdominal pain at this time. Denies any nausea and vomiting.   Past Medical History:  Diagnosis Date  . Memory changes   . Prostate disorder     Patient Active Problem List   Diagnosis Date Noted  . Sepsis ([place]) 08/24/2016  . Acute gastroenteritis 08/24/2016  . Community acquired pneumonia 08/24/2016  . Acute renal insufficiency 08/24/2016  . Hyperbilirubinemia 08/24/2016  . Elevated troponin 08/24/2016    Past Surgical History:  Procedure Laterality Date  . HERNIA REPAIR      Prior to Admission medications   Medication Sig Start Date End Date Taking? Authorizing Provider  dutasteride (AVODART) 0.5 MG capsule Take 1 capsule by mouth daily. 08/08/16   Historical Provider, MD  levofloxacin (LEVAQUIN) 250 MG tablet Take 1 tablet (250 mg total) by mouth daily. 08/25/16   Fritzi Mandes, MD  meloxicam (MOBIC) 15 MG tablet Take 1 tablet by mouth daily. 08/03/16   Historical Provider, MD  NAMZARIC 28-10 MG CP24 Take 1 capsule by mouth daily. 07/25/16   Historical  Provider, MD    Allergies Penicillins  No family history on file.  Social History Social History  Substance Use Topics  . Smoking status: Current Every Day Smoker    Packs/day: 0.50  . Smokeless tobacco: Never Used  . Alcohol use No    Review of Systems Constitutional: No fever/chills Eyes: No visual changes. ENT: No sore throat. Cardiovascular: Denies chest pain. Respiratory: Denies shortness of breath. Gastrointestinal: No abdominal pain.  No nausea, no vomiting.  No diarrhea.   Genitourinary: Negative for dysuria. Musculoskeletal: Negative for back pain. Skin: Negative for rash. Neurological: Negative for headaches, focal weakness or numbness.  10-point ROS otherwise negative.  ____________________________________________   PHYSICAL EXAM:  VITAL SIGNS: ED Triage Vitals  Enc Vitals Group     BP 12/12/16 1551 (!) 158/75     Pulse Rate 12/12/16 1551 93     Resp 12/12/16 1551 16     Temp 12/12/16 1553 98 F (36.7 C)     Temp Source 12/12/16 1553 Oral     SpO2 12/12/16 1551 94 %     Weight --      Height 12/12/16 1551 '6\' 2"'$  (1.88 m)     Head Circumference --      Peak Flow --      Pain Score 12/12/16 1552 5     Pain Loc --      Pain Edu? --      Excl. in [place]? --     Constitutional: Alert and oriented.  Well appearing and in no acute distress. Eyes: Conjunctivae are normal. PERRL. EOMI. Head: Atraumatic. Nose: No congestion/rhinnorhea. Mouth/Throat: Mucous membranes are moist.   Neck: No stridor.   Cardiovascular: Normal rate, regular rhythm. Grossly normal heart sounds.   Respiratory: Normal respiratory effort.  No retractions. Lungs CTAB. Gastrointestinal: Soft and nontender. No distention. Rectal exam with soft brown stool. No impaction. Musculoskeletal: No lower extremity tenderness nor edema.  No joint effusions. Neurologic:  Normal speech and language. No gross focal neurologic deficits are appreciated. No gait instability. Skin:  Skin is warm, dry  and intact. No rash noted. Psychiatric: Mood and affect are normal. Speech and behavior are normal.  ____________________________________________   LABS (all labs ordered are listed, but only abnormal results are displayed)  Labs Reviewed  LIPASE, BLOOD - Abnormal; Notable for the following:       Result Value   Lipase 166 (*)    All other components within normal limits  COMPREHENSIVE METABOLIC PANEL - Abnormal; Notable for the following:    Sodium 133 (*)    Glucose, Bld 106 (*)    BUN 25 (*)    Creatinine, Ser 1.35 (*)    Total Protein 8.5 (*)    AST 50 (*)    GFR calc non Af Amer 46 (*)    GFR calc Af Amer 53 (*)    All other components within normal limits  CBC - Abnormal; Notable for the following:    RBC 6.21 (*)    MCV 74.4 (*)    MCH 24.3 (*)    RDW 14.7 (*)    All other components within normal limits  URINALYSIS, COMPLETE (UACMP) WITH MICROSCOPIC - Abnormal; Notable for the following:    Color, Urine YELLOW (*)    APPearance CLEAR (*)    Protein, ur 30 (*)    Squamous Epithelial / LPF 0-5 (*)    All other components within normal limits  TROPONIN I   ____________________________________________  EKG  ED ECG REPORT I, Doran Stabler, the attending physician, personally viewed and interpreted this ECG.   Date: 12/12/2016  EKG Time: 1821  Rate: 94  Rhythm: Sinus rhythm with premature atrial complex.  Axis: Normal  Intervals:none  ST&T Change: No ST segment elevation or depression. No abnormal T-wave inversion.  ____________________________________________  [id number]  DG Chest 1 View (Final result)  Result time 12/12/16 18:26:14  Final result by Inez Catalina, MD (12/12/16 18:26:14)           Narrative:   CLINICAL DATA: Upper abdominal pain  EXAM: CHEST 1 VIEW  COMPARISON: 08/24/2016  FINDINGS: Cardiac shadow is mildly enlarged. Tortuosity of the thoracic aorta is again noted and stable. Postsurgical changes in the left chest wall  are noted. Patchy infiltrative changes noted in the right upper lobe just above the minor fissure. Some chronic changes in the bases bilaterally.  IMPRESSION: Mild right upper lobe infiltrate.   Electronically Signed By: Inez Catalina M.D. On: 12/12/2016 18:26          ____________________________________________   PROCEDURES  Procedure(s) performed:   Procedures  Critical Care performed:   ____________________________________________   INITIAL IMPRESSION / ASSESSMENT AND PLAN / ED COURSE  Pertinent labs & imaging results that were available during my care of the patient were reviewed by me and considered in my medical decision making (see chart for details).  ----------------------------------------- 7:31 PM on 12/12/2016 -----------------------------------------  Patient's daughter is now the bedside. She is concerned about him drinking less  water now and drinking more soda. We'll give him a liter of fluids prior to discharge. He has had a normal troponin. Has renal insufficiency which is had in the past. Possibly dehydration. Also decreased appetite which could be contributing. He had an elevated lipase but without any abdominal pain. No nausea and vomiting. I discussed this with both his daughter and the patient. He will be given a liter of fluids and then discharged home. He'll be following up his primary care doctor. The patient as well as the daughter understand the plan and are willing to comply. Likely not stooling because he is not eating.     ____________________________________________   FINAL CLINICAL IMPRESSION(S) / ED DIAGNOSES  Dehydration.    NEW MEDICATIONS STARTED DURING THIS VISIT:  New Prescriptions   No medications on file     Note:  This document was prepared using Dragon voice recognition software and may include unintentional dictation errors.    Orbie Pyo, MD 12/12/16 1932  Patient return for any worsening or  concerning symptoms. Especially belly pain.    Orbie Pyo, MD 12/12/16 [phone]

## 2016-12-12 NOTE — ED Notes (Signed)
(  [place] (Patient's daughter) She is going to run errands and then return to ED.

## 2016-12-12 NOTE — ED Notes (Signed)
Patient reports constipation x 1 week.  Patient reports small bowel movement yesterday.  Patient reports taking magnesium citrate.

## 2016-12-12 NOTE — ED Triage Notes (Signed)
Patient states "I'm constipated".  Unknown last BM.  Laxatives taken with no result.

## 2017-01-15 ENCOUNTER — Emergency Department
Admission: EM | Admit: 2017-01-15 | Discharge: 2017-01-15 | Disposition: A | Discharge status: Home / Self Care | Payer: Medicare HMO | Attending: Emergency Medicine | Admitting: Emergency Medicine

## 2017-01-15 ENCOUNTER — Emergency Department: Payer: Medicare HMO

## 2017-01-15 ENCOUNTER — Encounter: Payer: Self-pay | Admitting: Emergency Medicine

## 2017-01-15 DIAGNOSIS — F17200 Nicotine dependence, unspecified, uncomplicated: Secondary | ICD-10-CM | POA: Diagnosis not present

## 2017-01-15 DIAGNOSIS — I10 Essential (primary) hypertension: Secondary | ICD-10-CM | POA: Diagnosis not present

## 2017-01-15 DIAGNOSIS — R945 Abnormal results of liver function studies: Secondary | ICD-10-CM | POA: Diagnosis not present

## 2017-01-15 DIAGNOSIS — K5900 Constipation, unspecified: Secondary | ICD-10-CM | POA: Insufficient documentation

## 2017-01-15 DIAGNOSIS — Z791 Long term (current) use of non-steroidal anti-inflammatories (NSAID): Secondary | ICD-10-CM | POA: Diagnosis not present

## 2017-01-15 DIAGNOSIS — C3431 Malignant neoplasm of lower lobe, right bronchus or lung: Secondary | ICD-10-CM | POA: Diagnosis not present

## 2017-01-15 DIAGNOSIS — R7989 Other specified abnormal findings of blood chemistry: Secondary | ICD-10-CM

## 2017-01-15 DIAGNOSIS — R109 Unspecified abdominal pain: Secondary | ICD-10-CM | POA: Diagnosis present

## 2017-01-15 LAB — URINALYSIS, COMPLETE (UACMP) WITH MICROSCOPIC
BACTERIA UA: NONE SEEN
Bilirubin Urine: NEGATIVE
Glucose, UA: NEGATIVE mg/dL
Hgb urine dipstick: NEGATIVE
Ketones, ur: NEGATIVE mg/dL
Leukocytes, UA: NEGATIVE
Nitrite: NEGATIVE
PROTEIN: NEGATIVE mg/dL
RBC / HPF: NONE SEEN RBC/hpf (ref 0–5)
Specific Gravity, Urine: 1.025 (ref 1.005–1.030)
WBC UA: NONE SEEN WBC/hpf (ref 0–5)
pH: 5 (ref 5.0–8.0)

## 2017-01-15 LAB — COMPREHENSIVE METABOLIC PANEL
ALBUMIN: 3.1 g/dL — AB (ref 3.5–5.0)
ALT: 65 U/L — ABNORMAL HIGH (ref 17–63)
ANION GAP: 7 (ref 5–15)
AST: 88 U/L — ABNORMAL HIGH (ref 15–41)
Alkaline Phosphatase: 180 U/L — ABNORMAL HIGH (ref 38–126)
BUN: 22 mg/dL — ABNORMAL HIGH (ref 6–20)
CALCIUM: 9.5 mg/dL (ref 8.9–10.3)
CO2: 24 mmol/L (ref 22–32)
Chloride: 105 mmol/L (ref 101–111)
Creatinine, Ser: 1.38 mg/dL — ABNORMAL HIGH (ref 0.61–1.24)
GFR calc Af Amer: 51 mL/min — ABNORMAL LOW (ref >60)
GFR calc non Af Amer: 44 mL/min — ABNORMAL LOW (ref >60)
GLUCOSE: 165 mg/dL — AB (ref 65–99)
Potassium: 4.7 mmol/L (ref 3.5–5.1)
SODIUM: 136 mmol/L (ref 135–145)
Total Bilirubin: 0.8 mg/dL (ref 0.3–1.2)
Total Protein: 7.9 g/dL (ref 6.5–8.1)

## 2017-01-15 LAB — CBC
HEMATOCRIT: 44.4 % (ref 40.0–52.0)
HEMOGLOBIN: 14.1 g/dL (ref 13.0–18.0)
MCH: 23.8 pg — AB (ref 26.0–34.0)
MCHC: 31.8 g/dL — AB (ref 32.0–36.0)
MCV: 75 fL — ABNORMAL LOW (ref 80.0–100.0)
Platelets: 124 10*3/uL — ABNORMAL LOW (ref 150–440)
RBC: 5.92 MIL/uL — ABNORMAL HIGH (ref 4.40–5.90)
RDW: 15.1 % — ABNORMAL HIGH (ref 11.5–14.5)
WBC: 5.6 10*3/uL (ref 3.8–10.6)

## 2017-01-15 LAB — LIPASE, BLOOD: Lipase: 104 U/L — ABNORMAL HIGH (ref 11–51)

## 2017-01-15 MED ORDER — IOPAMIDOL (ISOVUE-300) INJECTION 61%
100.0000 mL | Freq: Once | INTRAVENOUS | Status: AC | PRN
  Administered 2017-01-15: 100 mL via INTRAVENOUS

## 2017-01-15 MED ORDER — IOPAMIDOL (ISOVUE-300) INJECTION 61%
15.0000 mL | INTRAVENOUS | Status: AC
  Administered 2017-01-15: 15 mL via ORAL

## 2017-01-15 NOTE — ED Notes (Signed)
Lab results reviewed. Awaiting room for MD eval.  

## 2017-01-15 NOTE — Discharge Instructions (Signed)
As we discussed, your CT scan is very concerning for lung cancer which has possibly spread to your liver. This needs to be evaluated by an oncologist, you should be hearing from the [place] [facility] tomorrow to set up an appointment for further evaluation.

## 2017-01-15 NOTE — ED Provider Notes (Signed)
[facility] ([place] Emergency Department Provider Note   ____________________________________________    I have reviewed the triage vital signs and the nursing notes.   HISTORY  Chief Complaint Abdominal Pain     HPI Wayne Le is a 83 y.o. male who presents with complaints of constipation. Patient reports he hasn't had a bowel movement in 5 days. Daughter is concerned because he doesn't eat as much as he once did, patient reports he usually eats 2 sandwiches day but also eats a lot of ice cream and chocolate milk. He states he feels well. No nausea or vomiting. No fevers.   Past Medical History:  Diagnosis Date  . Memory changes   . Prostate disorder     Patient Active Problem List   Diagnosis Date Noted  . Sepsis ([place]) 08/24/2016  . Acute gastroenteritis 08/24/2016  . Community acquired pneumonia 08/24/2016  . Acute renal insufficiency 08/24/2016  . Hyperbilirubinemia 08/24/2016  . Elevated troponin 08/24/2016    Past Surgical History:  Procedure Laterality Date  . HERNIA REPAIR      Prior to Admission medications   Medication Sig Start Date End Date Taking? Authorizing Provider  dutasteride (AVODART) 0.5 MG capsule Take 1 capsule by mouth daily. 08/08/16   Historical Provider, MD  levofloxacin (LEVAQUIN) 250 MG tablet Take 1 tablet (250 mg total) by mouth daily. 08/25/16   Fritzi Mandes, MD  meloxicam (MOBIC) 15 MG tablet Take 1 tablet by mouth daily. 08/03/16   Historical Provider, MD  NAMZARIC 28-10 MG CP24 Take 1 capsule by mouth daily. 07/25/16   Historical Provider, MD     Allergies Penicillins  No family history on file.  Social History Social History  Substance Use Topics  . Smoking status: Current Every Day Smoker    Packs/day: 0.50  . Smokeless tobacco: Never Used  . Alcohol use No    Review of Systems  Constitutional: No fever/chills Eyes: No visual changes.   Cardiovascular: Denies chest pain. Respiratory:  DeniesCough Gastrointestinal: No nausea and vomiting Genitourinary: Negative for dysuria. Musculoskeletal: Negative for back pain. Skin: Negative for rash. Neurological: Negative for headaches  10-point ROS otherwise negative.  ____________________________________________   PHYSICAL EXAM:  VITAL SIGNS: ED Triage Vitals  Enc Vitals Group     BP 01/15/17 1434 118/62     Pulse Rate 01/15/17 1434 90     Resp 01/15/17 1434 18     Temp 01/15/17 1434 97.7 F (36.5 C)     Temp Source 01/15/17 1434 Oral     SpO2 01/15/17 1434 96 %     Weight 01/15/17 1435 150 lb (68 kg)     Height 01/15/17 1435 '5\' 11"'$  (1.803 m)     Head Circumference --      Peak Flow --      Pain Score 01/15/17 1439 5     Pain Loc --      Pain Edu? --      Excl. in [place]? --     Constitutional: Alert.. No acute distress. Pleasant and interactive Eyes: Conjunctivae are normal.   Nose: No congestion/rhinnorhea. Mouth/Throat: Mucous membranes are moist.    Cardiovascular: Normal rate, regular rhythm. Grossly normal heart sounds.  Good peripheral circulation. Respiratory: Normal respiratory effort.  No retractions. Lungs CTAB. Gastrointestinal: Soft and nontender. No distention.  No CVA tenderness. Genitourinary: deferred Musculoskeletal: No lower extremity tenderness nor edema.  Warm and well perfused Neurologic:  Normal speech and language. No gross focal neurologic deficits are  appreciated.  Skin:  Skin is warm, dry and intact. No rash noted. Psychiatric: Mood and affect are normal. Speech and behavior are normal.  ____________________________________________   LABS (all labs ordered are listed, but only abnormal results are displayed)  Labs Reviewed  LIPASE, BLOOD - Abnormal; Notable for the following:       Result Value   Lipase 104 (*)    All other components within normal limits  COMPREHENSIVE METABOLIC PANEL - Abnormal; Notable for the following:    Glucose, Bld 165 (*)    BUN 22 (*)     Creatinine, Ser 1.38 (*)    Albumin 3.1 (*)    AST 88 (*)    ALT 65 (*)    Alkaline Phosphatase 180 (*)    GFR calc non Af Amer 44 (*)    GFR calc Af Amer 51 (*)    All other components within normal limits  CBC - Abnormal; Notable for the following:    RBC 5.92 (*)    MCV 75.0 (*)    MCH 23.8 (*)    MCHC 31.8 (*)    RDW 15.1 (*)    Platelets 124 (*)    All other components within normal limits  URINALYSIS, COMPLETE (UACMP) WITH MICROSCOPIC - Abnormal; Notable for the following:    Color, Urine AMBER (*)    APPearance CLEAR (*)    Squamous Epithelial / LPF 0-5 (*)    All other components within normal limits   ____________________________________________  EKG  ED ECG REPORT I, Lavonia Drafts, the attending physician, personally viewed and interpreted this ECG.  Date: 01/15/2017 EKG Time: 2:50 PM Rate: 93 Rhythm: normal sinus rhythm QRS Axis: normal Intervals: normal ST/T Wave abnormalities: normal Conduction Disturbances: LAFB   ____________________________________________  RADIOLOGY  CT scan concerning for primary mass right lower lobe with metastatic disease ____________________________________________   PROCEDURES  Procedure(s) performed: No    Critical Care performed: No ____________________________________________   INITIAL IMPRESSION / ASSESSMENT AND PLAN / ED COURSE  Pertinent labs & imaging results that were available during my care of the patient were reviewed by me and considered in my medical decision making (see chart for details).  Patient well-appearing and in no acute distress. He is quite comfortable. Abdominal exam is benign. Lab work is significant only for mild renal insufficiency which is chronic for him and also an elevation in his LFTs which appears new. We will x-ray his abdomen But also obtain ultrasound of his gallbladder and liver  Ultrasound showed concerning lesions in the liver, this prompted CT chest abdomen and pelvis  which shows likely primary mass in the right lower lobe and probable metastatic disease. Discussed at length with patient and family as well as oncologist Dr. Mike Gip, outpatient followup is arranged all questions answered and they are comfortable with plan ____________________________________________   FINAL CLINICAL IMPRESSION(S) / ED DIAGNOSES  Final diagnoses:  Constipation  Elevated LFTs  Malignant neoplasm of lower lobe of right lung ([place])      NEW MEDICATIONS STARTED DURING THIS VISIT:  New Prescriptions   No medications on file     Note:  This document was prepared using Dragon voice recognition software and may include unintentional dictation errors.    Lavonia Drafts, MD 01/15/17 2110

## 2017-01-15 NOTE — ED Triage Notes (Signed)
Pt reports mid abd pain for past month. Denies vomiting. States "its been a while since last BM". Denies pain

## 2017-01-15 NOTE — ED Notes (Signed)
Pt back from CT; family at bedside

## 2017-01-18 ENCOUNTER — Inpatient Hospital Stay: Payer: Medicare HMO | Attending: Hematology and Oncology | Admitting: Hematology and Oncology

## 2017-01-18 ENCOUNTER — Encounter: Payer: Self-pay | Admitting: Hematology and Oncology

## 2017-01-18 VITALS — BP 165/55 | HR 68 | Temp 96.8°F | Resp 18 | Ht 65.0 in | Wt 162.5 lb

## 2017-01-18 DIAGNOSIS — R918 Other nonspecific abnormal finding of lung field: Secondary | ICD-10-CM | POA: Diagnosis not present

## 2017-01-18 DIAGNOSIS — R2689 Other abnormalities of gait and mobility: Secondary | ICD-10-CM

## 2017-01-18 DIAGNOSIS — R944 Abnormal results of kidney function studies: Secondary | ICD-10-CM | POA: Insufficient documentation

## 2017-01-18 DIAGNOSIS — I2510 Atherosclerotic heart disease of native coronary artery without angina pectoris: Secondary | ICD-10-CM | POA: Diagnosis not present

## 2017-01-18 DIAGNOSIS — R634 Abnormal weight loss: Secondary | ICD-10-CM

## 2017-01-18 DIAGNOSIS — Z79899 Other long term (current) drug therapy: Secondary | ICD-10-CM | POA: Insufficient documentation

## 2017-01-18 DIAGNOSIS — F17210 Nicotine dependence, cigarettes, uncomplicated: Secondary | ICD-10-CM | POA: Insufficient documentation

## 2017-01-18 DIAGNOSIS — R630 Anorexia: Secondary | ICD-10-CM

## 2017-01-18 DIAGNOSIS — R2681 Unsteadiness on feet: Secondary | ICD-10-CM | POA: Diagnosis not present

## 2017-01-18 DIAGNOSIS — R413 Other amnesia: Secondary | ICD-10-CM | POA: Diagnosis not present

## 2017-01-18 DIAGNOSIS — R948 Abnormal results of function studies of other organs and systems: Secondary | ICD-10-CM | POA: Insufficient documentation

## 2017-01-18 DIAGNOSIS — R109 Unspecified abdominal pain: Secondary | ICD-10-CM | POA: Insufficient documentation

## 2017-01-18 DIAGNOSIS — N429 Disorder of prostate, unspecified: Secondary | ICD-10-CM

## 2017-01-18 DIAGNOSIS — K5900 Constipation, unspecified: Secondary | ICD-10-CM | POA: Diagnosis not present

## 2017-01-18 DIAGNOSIS — I700 Atherosclerosis of aorta: Secondary | ICD-10-CM | POA: Diagnosis not present

## 2017-01-18 DIAGNOSIS — M129 Arthropathy, unspecified: Secondary | ICD-10-CM | POA: Diagnosis not present

## 2017-01-18 DIAGNOSIS — R590 Localized enlarged lymph nodes: Secondary | ICD-10-CM

## 2017-01-18 DIAGNOSIS — K769 Liver disease, unspecified: Secondary | ICD-10-CM | POA: Diagnosis not present

## 2017-01-18 DIAGNOSIS — I10 Essential (primary) hypertension: Secondary | ICD-10-CM | POA: Diagnosis not present

## 2017-01-18 DIAGNOSIS — N2889 Other specified disorders of kidney and ureter: Secondary | ICD-10-CM | POA: Diagnosis not present

## 2017-01-18 DIAGNOSIS — J9809 Other diseases of bronchus, not elsewhere classified: Secondary | ICD-10-CM | POA: Diagnosis not present

## 2017-01-18 DIAGNOSIS — M25551 Pain in right hip: Secondary | ICD-10-CM | POA: Diagnosis not present

## 2017-01-18 DIAGNOSIS — M25552 Pain in left hip: Secondary | ICD-10-CM | POA: Insufficient documentation

## 2017-01-18 DIAGNOSIS — Z7982 Long term (current) use of aspirin: Secondary | ICD-10-CM | POA: Diagnosis not present

## 2017-01-18 DIAGNOSIS — R05 Cough: Secondary | ICD-10-CM

## 2017-01-18 DIAGNOSIS — J989 Respiratory disorder, unspecified: Secondary | ICD-10-CM | POA: Insufficient documentation

## 2017-01-18 DIAGNOSIS — Z66 Do not resuscitate: Secondary | ICD-10-CM | POA: Insufficient documentation

## 2017-01-18 DIAGNOSIS — I517 Cardiomegaly: Secondary | ICD-10-CM | POA: Diagnosis not present

## 2017-01-18 NOTE — Progress Notes (Signed)
[facility] day:  01/18/2017  Chief Complaint: Wayne Le is a 83 y.o. male with metastatic disease who is referred by Dr. Lavonia Drafts in consultation for assessment and management.   HPI:  The patient presented to [facility] emergency room on 01/15/2017 with abdominal pain and constipation. Labs included a hematocrit of 46.2, hemoglobin 13.1, MCV 74.4, platelets 174,000 and white count 5100. Comprehensive metabolic panel noted a creatinine of 1.35.  Liver function tests included an AST of 50 and an alkaline phosphatase of 180.  Lipase was 166.  CXR revealed a chronic lung disease with fibrosis.  Chest and abdomen CT scan revealed masslike consolidation the right lower lobe with scattered bilateral discrete nodular opacities.  There was bilateral hilar and mediastinal adenopathy.  There was aspirate material within the trachea and right lower lobe bronchi causing right lower lobe bronchial obstruction.  Findings were likely due to right lower lobe primary lung cancer with metastatic disease, but cannot exclude superimposed infection. There were multiple low-density liver lesions (largest 2.6 cm over the dome of the liver) compatible with metastatic disease.  There was emphysematous disease with scattered fibrotic changes.  There was mild cardiomegaly, left main and 3 vessel atherosclerotic coronary artery disease, aortic atherosclerosis, and mild ectasia of the ascending thoracic aorta measuring 3.7 cm.  He has a 50+ pack year smoking history.  He began smoking as a young boy.  He initially smoked 1 pack a day.  He cut back 10 years ago.  A pack of cigarettes now lasts more than a week.  Symptomatically, he feels "pretty good".  He has lost 10 pounds over an unspecified period of time.  He is not eating well secondary to a poor appetite.  He denies any shortness of breath or change in cough.  He denies any hemoptysis or fever.  He notes some abdominal pain  "in the lower part".  He has new bilateral hip/upper thigh pain.  His balance and coordination is a little off.  He has had no falls.   Past Medical History:  Diagnosis Date  . Arthritis   . Cataract   . Hypertension   . Memory changes   . Prostate disorder     Past Surgical History:  Procedure Laterality Date  . HERNIA REPAIR      History reviewed. No pertinent family history.  Social History:  reports that he has been smoking.  He has been smoking about 0.25 packs per day. He has never used smokeless tobacco. He reports that he does not drink alcohol or use drugs.  He has a 50+ pack year smoking history.  He began smoking as a young boy.  He initially smoked 1 pack a day.  He cut back 10 years ago.  A pack of cigarettes now lasts a week.  He previously drank a lot.  He stopped drinking 10 years ago.  He lives independently in [place].  The patient is accompanied by his 2 daughter, Wayne Le (works at [employer]) and Wayne Le"). today.  Allergies:  Allergies  Allergen Reactions  . Iodine Swelling  . Other Other (See Comments)  . Penicillins Hives and Rash    Current Medications: Current Outpatient Prescriptions  Medication Sig Dispense Refill  . aspirin EC 81 MG tablet Take 81 mg by mouth daily.    Marland Kitchen docusate sodium (COLACE) 50 MG capsule Take 50 mg by mouth daily.    Marland Kitchen dutasteride (AVODART) 0.5 MG capsule Take 1  capsule by mouth daily.    . finasteride (PROSCAR) 5 MG tablet Take 5 mg by mouth daily.    . meloxicam (MOBIC) 15 MG tablet Take 1 tablet by mouth daily.    . mirtazapine (REMERON) 15 MG tablet Take 15 mg by mouth at bedtime.    Marland Kitchen NAMZARIC 28-10 MG CP24 Take 1 capsule by mouth daily.    . Omega 3 1000 MG CAPS Take 1,000 mg by mouth daily.    Marland Kitchen levofloxacin (LEVAQUIN) 250 MG tablet Take 1 tablet (250 mg total) by mouth daily. (Patient not taking: Reported on 01/18/2017) 4 tablet 0   No current facility-administered medications for this visit.      Review of Systems:  GENERAL:  Feels "pretty good".  No fevers or sweats.  Weight loss of 10 pounds over an unspecified period of time. PERFORMANCE STATUS (ECOG):  1 HEENT:  Vision changes (needs glasses for reading- old).  Runny nose.  No sore throat, mouth sores or tenderness. Lungs: No shortness of breath.  Chronic cough (no change).  No hemoptysis. Cardiac:  No chest pain, palpitations, orthopnea, or PND. GI:  Poor appetite.  Lower abdominal discomfort.  Constipation.  Using laxatives and magnesium citrate.  No nausea, vomiting, diarrhea, melena or hematochezia. GU:  No urgency, frequency, dysuria, or hematuria. Musculoskeletal:  Bilateral hip/upper thigh pain.  No back pain.  No joint pain.  No muscle tenderness. Extremities:  No pain or swelling. Skin:  No rashes or skin changes. Neuro:  No headache, numbness or weakness.  No dizziness.  Balance or coordination issues. No falls. Endocrine:  No diabetes, thyroid issues, hot flashes or night sweats. Psych:  No mood changes, depression or anxiety. Pain:  No focal pain. Review of systems:  All other systems reviewed and found to be negative.  Physical Exam: Blood pressure (!) 165/55, pulse 68, temperature (!) 96.8 F (36 C), temperature source Tympanic, resp. rate 18, height 5' 5"  (1.651 m), weight 162 lb 7.7 oz (73.7 kg). GENERAL: Thin elderly gentleman sitting comfortably in the exam room in no acute distress. MENTAL STATUS:  Alert and oriented to person, place and time. HEAD:  Gray curly hair.  Lu Duffel.  Normocephalic, atraumatic, face symmetric, no Cushingoid features. EYES:  Brown eyes.  Pupils equal round and reactive to light and accomodation.  No conjunctivitis or scleral icterus. ENT:  Oropharynx clear without lesion.  Edentulous.  Tongue normal. Mucous membranes moist.  RESPIRATORY:  Decreased breath sounds RLL.  Otherwise, clear to auscultation without rales, wheezes or rhonchi. CARDIOVASCULAR:  Regular rate and rhythm  without murmur, rub or gallop. ABDOMEN:  Soft, non-tender, with active bowel sounds, and no splenomegaly.  Liver palpable medially below RCM.  No masses. SKIN:  No rashes, ulcers or lesions. EXTREMITIES: No edema, no skin discoloration or tenderness.  No palpable cords. LYMPH NODES: No palpable cervical, supraclavicular, axillary or inguinal adenopathy. NEURO:  Alert & oriented, cranial nerves II-XII intact; motor strength 5/5 throughout; sensation intact; finger to nose with some past pointing.  RAM normal; unable to walk heel to toe; negative Rhomberg; 1+ bilateral patellar reflexes .  PSYCH:  Appropriate.   No visits with results within 3 Day(s) from this visit.  Latest known visit with results is:  Admission on 01/15/2017, Discharged on 01/15/2017  Component Date Value Ref Range Status  . Lipase 01/15/2017 104* 11 - 51 U/L Final  . Sodium 01/15/2017 136  135 - 145 mmol/L Final  . Potassium 01/15/2017 4.7  3.5 -  5.1 mmol/L Final  . Chloride 01/15/2017 105  101 - 111 mmol/L Final  . CO2 01/15/2017 24  22 - 32 mmol/L Final  . Glucose, Bld 01/15/2017 165* 65 - 99 mg/dL Final  . BUN 01/15/2017 22* 6 - 20 mg/dL Final  . Creatinine, Ser 01/15/2017 1.38* 0.61 - 1.24 mg/dL Final  . Calcium 01/15/2017 9.5  8.9 - 10.3 mg/dL Final  . Total Protein 01/15/2017 7.9  6.5 - 8.1 g/dL Final  . Albumin 01/15/2017 3.1* 3.5 - 5.0 g/dL Final  . AST 01/15/2017 88* 15 - 41 U/L Final  . ALT 01/15/2017 65* 17 - 63 U/L Final  . Alkaline Phosphatase 01/15/2017 180* 38 - 126 U/L Final  . Total Bilirubin 01/15/2017 0.8  0.3 - 1.2 mg/dL Final  . GFR calc non Af Amer 01/15/2017 44* >60 mL/min Final  . GFR calc Af Amer 01/15/2017 51* >60 mL/min Final   Comment: (NOTE) The eGFR has been calculated using the CKD EPI equation. This calculation has not been validated in all clinical situations. eGFR's persistently <60 mL/min signify possible Chronic Kidney Disease.   . Anion gap 01/15/2017 7  5 - 15 Final  . WBC  01/15/2017 5.6  3.8 - 10.6 K/uL Final  . RBC 01/15/2017 5.92* 4.40 - 5.90 MIL/uL Final  . Hemoglobin 01/15/2017 14.1  13.0 - 18.0 g/dL Final  . HCT 01/15/2017 44.4  40.0 - 52.0 % Final  . MCV 01/15/2017 75.0* 80.0 - 100.0 fL Final  . MCH 01/15/2017 23.8* 26.0 - 34.0 pg Final  . MCHC 01/15/2017 31.8* 32.0 - 36.0 g/dL Final  . RDW 01/15/2017 15.1* 11.5 - 14.5 % Final  . Platelets 01/15/2017 124* 150 - 440 K/uL Final  . Color, Urine 01/15/2017 AMBER* YELLOW Final  . APPearance 01/15/2017 CLEAR* CLEAR Final  . Specific Gravity, Urine 01/15/2017 1.025  1.005 - 1.030 Final  . pH 01/15/2017 5.0  5.0 - 8.0 Final  . Glucose, UA 01/15/2017 NEGATIVE  NEGATIVE mg/dL Final  . Hgb urine dipstick 01/15/2017 NEGATIVE  NEGATIVE Final  . Bilirubin Urine 01/15/2017 NEGATIVE  NEGATIVE Final  . Ketones, ur 01/15/2017 NEGATIVE  NEGATIVE mg/dL Final  . Protein, ur 01/15/2017 NEGATIVE  NEGATIVE mg/dL Final  . Nitrite 01/15/2017 NEGATIVE  NEGATIVE Final  . Leukocytes, UA 01/15/2017 NEGATIVE  NEGATIVE Final  . RBC / HPF 01/15/2017 NONE SEEN  0 - 5 RBC/hpf Final  . WBC, UA 01/15/2017 NONE SEEN  0 - 5 WBC/hpf Final  . Bacteria, UA 01/15/2017 NONE SEEN  NONE SEEN Final  . Squamous Epithelial / LPF 01/15/2017 0-5* NONE SEEN Final  . Mucous 01/15/2017 PRESENT   Final    Assessment:  DRELYN PISTILLI is a 83 y.o. male with imaging studies worrisome for metastatic lung cancer.  He has a 50+ pack year smoking history.    Chest and abdomen CT scan on 01/15/2017 revealed masslike consolidation the right lower lobe with scattered bilateral discrete nodular opacities.  There was bilateral hilar and mediastinal adenopathy.  There was aspirate material within the trachea and right lower lobe bronchi causing right lower lobe bronchial obstruction.  There were multiple low-density liver lesions (largest 2.6 cm over the dome of the liver) compatible with metastatic disease.   He has chronic renal insufficiency.  Alkaline  phosphatase was elevated (180) on 01/15/2017.  Symptomatically, he feels "pretty good".  He has lost 10 pounds over an unspecified period of time.  He has a chronic cough.  He notes lower abdominal  discomfort and bilateral hip/upper thigh pain.  His balance and coordination are a little off.  Exam reveals decreased breath sounds in the RLL.  He can not walk heel to toe.  He past points on finger to nose exam.  Code status is DNR/DNI.  Plan: 1.  Review imaging studies with the patient and his 2 daughters.  Discuss concern for a right lower lobe lung mass and metastatic disease to the liver.  He is interested in pursuing a biopsy and possibly treatment.  I have reached out to interventional radiology and pulmonary medicine.  I discussed bronchoscopy as well as CT or ultrasound guided liver biopsy.  Risks were reviewed.  I discussed obtaining a PET scan to fully assess metastatic disease and potentially choosing a less invasive site for biopsy.  Discuss head MRI without contrast to r/o metastatic disease.  Discuss potential future bone scan if PET scan negative given elevated alkaline phosphatase.  2.  Preliminary discussions held regarding treatment of metastatic lung cancer.  Await biopsy (and mutational studies) prior to discussion regarding treatment plan.  Patient interested in learning about his options and potential side effects of treatment.  3.  Discuss paperwork for medical power of attorney and living will.  Discuss code status issues.  Patient does not want heroic measures (CPR, chest compressions, intubation and ventilation).  4.  Schedule PET scan. 5.  Schedule head MRI. 6.  Paperwork for living will and medical power of attorney. 7.  Review images with interventional radiology and pulmonary medicine. 8.  RTC after imaging. Lequita Asal, MD  01/18/2017, 11:24 AM

## 2017-01-18 NOTE — Progress Notes (Signed)
Patient here today as new evaluation regarding metastatic disease of the lung.  Referred by ED.  Accompanied by his daughters [name] and [place].

## 2017-01-23 ENCOUNTER — Other Ambulatory Visit: Payer: Self-pay

## 2017-01-23 ENCOUNTER — Ambulatory Visit
Admission: RE | Admit: 2017-01-23 | Discharge: 2017-01-23 | Disposition: A | Discharge status: Home / Self Care | Payer: Medicare HMO | Source: Ambulatory Visit | Attending: Oncology | Admitting: Oncology

## 2017-01-23 ENCOUNTER — Inpatient Hospital Stay: Payer: Medicare HMO | Attending: Hematology and Oncology

## 2017-01-23 ENCOUNTER — Emergency Department
Admission: EM | Admit: 2017-01-23 | Discharge: 2017-01-23 | Disposition: A | Discharge status: Home / Self Care | Payer: Medicare HMO | Attending: Emergency Medicine | Admitting: Emergency Medicine

## 2017-01-23 ENCOUNTER — Encounter: Payer: Self-pay | Admitting: Emergency Medicine

## 2017-01-23 ENCOUNTER — Encounter: Payer: Self-pay | Admitting: *Deleted

## 2017-01-23 ENCOUNTER — Other Ambulatory Visit
Admission: RE | Admit: 2017-01-23 | Discharge: 2017-01-23 | Disposition: A | Discharge status: Home / Self Care | Payer: Medicare HMO | Source: Ambulatory Visit | Attending: Oncology | Admitting: Oncology

## 2017-01-23 ENCOUNTER — Telehealth: Payer: Self-pay | Admitting: *Deleted

## 2017-01-23 DIAGNOSIS — F17210 Nicotine dependence, cigarettes, uncomplicated: Secondary | ICD-10-CM | POA: Insufficient documentation

## 2017-01-23 DIAGNOSIS — F17200 Nicotine dependence, unspecified, uncomplicated: Secondary | ICD-10-CM | POA: Insufficient documentation

## 2017-01-23 DIAGNOSIS — R1030 Lower abdominal pain, unspecified: Secondary | ICD-10-CM

## 2017-01-23 DIAGNOSIS — I129 Hypertensive chronic kidney disease with stage 1 through stage 4 chronic kidney disease, or unspecified chronic kidney disease: Secondary | ICD-10-CM | POA: Insufficient documentation

## 2017-01-23 DIAGNOSIS — R918 Other nonspecific abnormal finding of lung field: Secondary | ICD-10-CM | POA: Insufficient documentation

## 2017-01-23 DIAGNOSIS — R748 Abnormal levels of other serum enzymes: Secondary | ICD-10-CM | POA: Insufficient documentation

## 2017-01-23 DIAGNOSIS — R590 Localized enlarged lymph nodes: Secondary | ICD-10-CM | POA: Insufficient documentation

## 2017-01-23 DIAGNOSIS — J189 Pneumonia, unspecified organism: Secondary | ICD-10-CM

## 2017-01-23 DIAGNOSIS — J181 Lobar pneumonia, unspecified organism: Secondary | ICD-10-CM | POA: Diagnosis not present

## 2017-01-23 DIAGNOSIS — K769 Liver disease, unspecified: Secondary | ICD-10-CM | POA: Insufficient documentation

## 2017-01-23 DIAGNOSIS — K5900 Constipation, unspecified: Secondary | ICD-10-CM | POA: Insufficient documentation

## 2017-01-23 DIAGNOSIS — M899 Disorder of bone, unspecified: Secondary | ICD-10-CM | POA: Insufficient documentation

## 2017-01-23 DIAGNOSIS — K5641 Fecal impaction: Secondary | ICD-10-CM | POA: Insufficient documentation

## 2017-01-23 DIAGNOSIS — I10 Essential (primary) hypertension: Secondary | ICD-10-CM | POA: Diagnosis not present

## 2017-01-23 DIAGNOSIS — Z79899 Other long term (current) drug therapy: Secondary | ICD-10-CM | POA: Insufficient documentation

## 2017-01-23 DIAGNOSIS — N429 Disorder of prostate, unspecified: Secondary | ICD-10-CM | POA: Insufficient documentation

## 2017-01-23 DIAGNOSIS — K6289 Other specified diseases of anus and rectum: Secondary | ICD-10-CM | POA: Insufficient documentation

## 2017-01-23 DIAGNOSIS — R109 Unspecified abdominal pain: Secondary | ICD-10-CM | POA: Diagnosis present

## 2017-01-23 DIAGNOSIS — Z7982 Long term (current) use of aspirin: Secondary | ICD-10-CM | POA: Insufficient documentation

## 2017-01-23 DIAGNOSIS — N189 Chronic kidney disease, unspecified: Secondary | ICD-10-CM | POA: Insufficient documentation

## 2017-01-23 DIAGNOSIS — Z66 Do not resuscitate: Secondary | ICD-10-CM | POA: Insufficient documentation

## 2017-01-23 DIAGNOSIS — M129 Arthropathy, unspecified: Secondary | ICD-10-CM | POA: Insufficient documentation

## 2017-01-23 DIAGNOSIS — R413 Other amnesia: Secondary | ICD-10-CM | POA: Insufficient documentation

## 2017-01-23 DIAGNOSIS — I4892 Unspecified atrial flutter: Secondary | ICD-10-CM | POA: Diagnosis not present

## 2017-01-23 HISTORY — DX: Malignant (primary) neoplasm, unspecified: C80.1

## 2017-01-23 LAB — URINALYSIS, COMPLETE (UACMP) WITH MICROSCOPIC
BILIRUBIN URINE: NEGATIVE
Bacteria, UA: NONE SEEN
GLUCOSE, UA: NEGATIVE mg/dL
Hgb urine dipstick: NEGATIVE
Ketones, ur: 5 mg/dL — AB
LEUKOCYTES UA: NEGATIVE
NITRITE: NEGATIVE
PH: 5 (ref 5.0–8.0)
Protein, ur: 30 mg/dL — AB
SPECIFIC GRAVITY, URINE: 1.029 (ref 1.005–1.030)

## 2017-01-23 LAB — CBC
HCT: 46.9 % (ref 40.0–52.0)
Hemoglobin: 15 g/dL (ref 13.0–18.0)
MCH: 23.7 pg — AB (ref 26.0–34.0)
MCHC: 31.9 g/dL — AB (ref 32.0–36.0)
MCV: 74.4 fL — ABNORMAL LOW (ref 80.0–100.0)
PLATELETS: 155 10*3/uL (ref 150–440)
RBC: 6.31 MIL/uL — AB (ref 4.40–5.90)
RDW: 15.5 % — ABNORMAL HIGH (ref 11.5–14.5)
WBC: 6.1 10*3/uL (ref 3.8–10.6)

## 2017-01-23 LAB — COMPREHENSIVE METABOLIC PANEL
ALK PHOS: 189 U/L — AB (ref 38–126)
ALT: 79 U/L — AB (ref 17–63)
AST: 111 U/L — ABNORMAL HIGH (ref 15–41)
Albumin: 3.2 g/dL — ABNORMAL LOW (ref 3.5–5.0)
Anion gap: 6 (ref 5–15)
BUN: 22 mg/dL — ABNORMAL HIGH (ref 6–20)
CALCIUM: 9.4 mg/dL (ref 8.9–10.3)
CO2: 23 mmol/L (ref 22–32)
CREATININE: 1.32 mg/dL — AB (ref 0.61–1.24)
Chloride: 107 mmol/L (ref 101–111)
GFR calc non Af Amer: 47 mL/min — ABNORMAL LOW (ref >60)
GFR, EST AFRICAN AMERICAN: 54 mL/min — AB (ref >60)
GLUCOSE: 108 mg/dL — AB (ref 65–99)
Potassium: 4.8 mmol/L (ref 3.5–5.1)
Sodium: 136 mmol/L (ref 135–145)
Total Bilirubin: 0.8 mg/dL (ref 0.3–1.2)
Total Protein: 8.2 g/dL — ABNORMAL HIGH (ref 6.5–8.1)

## 2017-01-23 LAB — LIPASE, BLOOD: Lipase: 96 U/L — ABNORMAL HIGH (ref 11–51)

## 2017-01-23 MED ORDER — AZITHROMYCIN 250 MG PO TABS
ORAL_TABLET | ORAL | Status: AC
  Administered 2017-01-23: 250 mg
  Filled 2017-01-23: qty 1

## 2017-01-23 MED ORDER — AZITHROMYCIN 250 MG PO TABS: ORAL_TABLET | ORAL | 0 refills | Status: DC

## 2017-01-23 MED ORDER — FENTANYL CITRATE (PF) 100 MCG/2ML IJ SOLN
50.0000 ug | Freq: Once | INTRAMUSCULAR | Status: AC
  Administered 2017-01-23: 50 ug via INTRAVENOUS
  Filled 2017-01-23: qty 2

## 2017-01-23 MED ORDER — SODIUM CHLORIDE 0.9 % IV BOLUS (SEPSIS)
1000.0000 mL | Freq: Once | INTRAVENOUS | Status: AC
  Administered 2017-01-23: 1000 mL via INTRAVENOUS

## 2017-01-23 NOTE — ED Triage Notes (Signed)
Pt returns to ed for pain in his abdomen and rectum. He states he has not had a bowel movement in two weeks. He has lung cancer and has been seen by several doctors, but has not been given anything except stool softener. Pt states that he had an xray today, as well as urine, ordered by the cancer center. Pt alert & oriented with NAD noted.

## 2017-01-23 NOTE — Patient Instructions (Signed)
Advised to go to ER per Dr Humberto Seals message and Dr Gary Fleet verbal orderafter she refused to leave until someone sees him regarding his pain.

## 2017-01-23 NOTE — ED Notes (Signed)
Pt is in and out of flutter/ NSR with PAC. Rhythm variable. Dr Jacqualine Code aware

## 2017-01-23 NOTE — ED Provider Notes (Signed)
[facility] Emergency Department Provider Note  ___________________________________________   First MD Initiated Contact with Patient 01/23/17 1808     (approximate)  I have reviewed the triage vital signs and the nursing notes.   HISTORY  Chief Complaint Abdominal Pain   HPI Wayne Le is a 83 y.o. male who presents to the emergency department for evaluation of abdominal pain and constipation. He was recently diagnosed with lung cancer. Today, he had appointment with the oncologist who referred him to the ER for evaluation of constipation. He is scheduled for PET scan on 01/25/17. He denies nausea or vomiting, but does not have "any" appetite. He has not had a bowel movement for about 2 weeks.    Past Medical History:  Diagnosis Date  . Arthritis   . Cancer ([place])   . Cataract   . Hypertension   . Memory changes   . Prostate disorder     Patient Active Problem List   Diagnosis Date Noted  . Right lower lobe lung mass 01/18/2017  . Mediastinal adenopathy 01/18/2017  . Liver lesion 01/18/2017  . Balance problems 01/18/2017  . Sepsis ([place]) 08/24/2016  . Acute gastroenteritis 08/24/2016  . Community acquired pneumonia 08/24/2016  . Acute renal insufficiency 08/24/2016  . Hyperbilirubinemia 08/24/2016  . Elevated troponin 08/24/2016    Past Surgical History:  Procedure Laterality Date  . HERNIA REPAIR      Prior to Admission medications   Medication Sig Start Date End Date Taking? Authorizing Provider  docusate sodium (COLACE) 50 MG capsule Take 50 mg by mouth daily.   Yes Historical Provider, MD  dutasteride (AVODART) 0.5 MG capsule Take 1 capsule by mouth daily. 08/08/16  Yes Historical Provider, MD  finasteride (PROSCAR) 5 MG tablet Take 5 mg by mouth daily.   Yes Historical Provider, MD  meloxicam (MOBIC) 15 MG tablet Take 1 tablet by mouth daily. 08/03/16  Yes Historical Provider, MD  mirtazapine (REMERON) 15 MG tablet Take 15 mg by  mouth at bedtime.   Yes Historical Provider, MD  NAMZARIC 28-10 MG CP24 Take 1 capsule by mouth daily. 07/25/16  Yes Historical Provider, MD  Omega 3 1000 MG CAPS Take 1,000 mg by mouth daily.   Yes Historical Provider, MD  aspirin EC 81 MG tablet Take 81 mg by mouth daily.    Historical Provider, MD  azithromycin (ZITHROMAX) 250 MG tablet 2 tablets today, then 1 tablet for the next 4 days. 01/23/17   Victorino Dike, FNP  levofloxacin (LEVAQUIN) 250 MG tablet Take 1 tablet (250 mg total) by mouth daily. Patient not taking: Reported on 01/18/2017 08/25/16   Fritzi Mandes, MD    Allergies Iodine; Other; and Penicillins  History reviewed. No pertinent family history.  Social History Social History  Substance Use Topics  . Smoking status: Current Every Day Smoker    Packs/day: 0.25  . Smokeless tobacco: Never Used     Comment: Pack lasts one week  . Alcohol use No    Review of Systems Constitutional: No fever/chills. Eyes: No visual changes. ENT: No sore throat. Cardiovascular: Denies chest pain. Respiratory: Denies shortness of breath. Gastrointestinal: Positive for abdominal pain.  No nausea, no vomiting. Positive for constipation. Genitourinary: Negative for dysuria. Musculoskeletal: Positive for back pain. Skin: Negative for rash. ____________________________________________   PHYSICAL EXAM:  VITAL SIGNS: ED Triage Vitals  Enc Vitals Group     BP 01/23/17 1712 (!) 125/99     Pulse Rate 01/23/17 1712 76  Resp 01/23/17 1712 18     Temp 01/23/17 1712 97.7 F (36.5 C)     Temp Source 01/23/17 1712 Oral     SpO2 01/23/17 1712 96 %     Weight 01/23/17 1713 160 lb (72.6 kg)     Height 01/23/17 1713 '5\' 9"'$  (1.753 m)     Head Circumference --      Peak Flow --      Pain Score 01/23/17 1718 10     Pain Loc --      Pain Edu? --      Excl. in [place]? --     Constitutional: Alert and oriented. Chronically ill appearing and in no acute distress. Eyes: Conjunctivae are normal.  EOMI. Head: Atraumatic. Mouth/Throat: Mucous membranes are dry. Neck: No stridor.   Cardiovascular: Normal rate, regular rhythm. Grossly normal heart sounds.  Good peripheral circulation. Respiratory: Normal respiratory effort.  No retractions. Lungs CTAB. Gastrointestinal: Soft and nontender to palpation. No distention. No abdominal bruits. No CVA tenderness. Large amount of firm stool low in the rectum. Musculoskeletal: No lower extremity tenderness nor edema.  No joint effusions. Neurologic:  Normal speech and language. No gross focal neurologic deficits are appreciated. Skin:  Skin is warm, dry and intact. No rash noted. Psychiatric: Mood and affect are normal. Speech and behavior are normal.  ____________________________________________   LABS (all labs ordered are listed, but only abnormal results are displayed)  Labs Reviewed  LIPASE, BLOOD - Abnormal; Notable for the following:       Result Value   Lipase 96 (*)    All other components within normal limits  COMPREHENSIVE METABOLIC PANEL - Abnormal; Notable for the following:    Glucose, Bld 108 (*)    BUN 22 (*)    Creatinine, Ser 1.32 (*)    Total Protein 8.2 (*)    Albumin 3.2 (*)    AST 111 (*)    ALT 79 (*)    Alkaline Phosphatase 189 (*)    GFR calc non Af Amer 47 (*)    GFR calc Af Amer 54 (*)    All other components within normal limits  CBC - Abnormal; Notable for the following:    RBC 6.31 (*)    MCV 74.4 (*)    MCH 23.7 (*)    MCHC 31.9 (*)    RDW 15.5 (*)    All other components within normal limits   ____________________________________________  EKG  No STEMI. Atrial flutter. ____________________________________________  RADIOLOGY  Not indicated. Images from earlier today reviewed. Abdomen and chest x-ray without indication of bowel obstruction.  Chest x-ray showing concern for Right upper lobe interstitial prominence concerning for  pneumonia. ____________________________________________   PROCEDURES  Procedure(s) performed:------------------------------------------------------------------------------------------------------------------- Fecal Disimpaction Procedure Note:  Performed by me:  Patient placed in the lateral recumbent position with knees drawn towards chest. Nurse present for patient support. Large amount of hard brown stool removed. No complications during procedure. ----------------------------------------------------------------------------------------------------------------  Procedures  Critical Care performed: No  ____________________________________________   INITIAL IMPRESSION / ASSESSMENT AND PLAN / ED COURSE  Pertinent labs & imaging results that were available during my care of the patient were reviewed by me and considered in my medical decision making (see chart for details).  83 year old male presenting to the emergency department for evaluation of abdominal pain. After disimpaction, soap suds enema given with small return of stool. Patient requested discharge so he could feel more comfortable using the bathroom at home. He reported significant decrease in abdominal  and back pain after disimpaction and small bowel movement. He was advised that he needs to increase his food and fluid intake and Ensure was recommended.  He was advised to take the azithromycin as prescribed and until finished for the possible pneumonia and to make sure to keep the scheduled follow up appointments with oncology. His family was present and verbalize willingness to help him remember the appointments and provide transportation.  He was given strict ER return precautions prior to discharge.   ____________________________________________   FINAL CLINICAL IMPRESSION(S) / ED DIAGNOSES  Final diagnoses:  Constipation, unspecified constipation type  Community acquired pneumonia of right lower lobe of lung ([place])   Atrial flutter, unspecified type ([place])      NEW MEDICATIONS STARTED DURING THIS VISIT:  Discharge Medication List as of 01/23/2017  8:36 PM    START taking these medications   Details  azithromycin (ZITHROMAX) 250 MG tablet 2 tablets today, then 1 tablet for the next 4 days., Print         Note:  This document was prepared using Dragon voice recognition software and may include unintentional dictation errors.    Victorino Dike, FNP 01/27/17 [place], MD 02/01/17 2244

## 2017-01-23 NOTE — ED Notes (Signed)
Report received from [facility]. Patient care assumed. Patient/RN introduction complete. Will continue to monitor.

## 2017-01-23 NOTE — ED Notes (Signed)
Patient discharged to home per MD order. Patient in stable condition, and deemed medically cleared by ED provider for discharge. Discharge instructions reviewed with patient/family using "Teach Back"; verbalized understanding of medication education and administration, and information about follow-up care. Denies further concerns. ° °

## 2017-01-23 NOTE — ED Notes (Signed)
Pt daughter reports a new lung mass in R lung discovered at apt with cancer doc

## 2017-01-23 NOTE — ED Notes (Signed)
PA at bedside for eval.

## 2017-01-23 NOTE — ED Notes (Signed)
Pt resting comfortably with family at bedside without complaints or distress noted at this time.

## 2017-01-23 NOTE — Discharge Instructions (Signed)
Please increase your fluid intake--try Ensure to help you get your calories. Follow up with your doctors, including cardiology. Return to the ER for symptoms that change or worsen.

## 2017-01-23 NOTE — Telephone Encounter (Signed)
Per Dr Grayland Ormond, get 3 way abd to r/o obstruction, UA, Urine culture to r/o infection. I spoke with Ms Wayne Le and she will bring him over to [facility] then bring him here for urine specimen. Lab informed of add on

## 2017-01-23 NOTE — ED Notes (Signed)
Pt states he feels much better, still no results from the enema but feels like he can go home, MD made aware and will be discharged with family.

## 2017-01-23 NOTE — ED Notes (Signed)
Enema given per md order, pt tolerated well.

## 2017-01-23 NOTE — Telephone Encounter (Signed)
  Unclear what this lower abdominal pain is that he is referring to.  I think he probably needs an ER evaluation to sort this out with as much pain as he is having.  I don't want to give him a narcotic to mask something especially if this is a bowel issue.  M

## 2017-01-23 NOTE — ED Provider Notes (Signed)
Medical screening examination/treatment/procedure(s) were conducted as a shared visit with non-physician practitioner(s) and myself.  I personally evaluated the patient during the encounter.  Patient here from the clinic because of not having a bowel movement in about 2 weeks. He is however passing gas. Imaging study of the clinic concerning for possible infiltrative process, though he also has known lung cancer. He's been following closely and has an los follow-up and outpatient workup plan with oncology.  Plan to hydrate him here, his LFTs appear relatively stable, and is not complaining of any right-sided right upper quadrant abdominal pain. His lipase is improving.  Awake and alert, stable hemodynamics. He is in no obvious distress.  ----------------------------------------- 8:04 PM on 01/23/2017 -----------------------------------------  Patient disimpacted, he reports significant relief and improvement thereafter. Discussed with patient and his daughters, we'll place him on antibiotic in the event he is started developing pneumonia though he does not have significant symptomatology of it given his confounding cancer would seem reasonable. He is much improved now.     Delman Kitten, MD 01/24/17 0000

## 2017-01-23 NOTE — Telephone Encounter (Signed)
Called to report that he is having increasing lower abdominal pain to the point of him being in tears. Unable to even sit comfortably. His bowels are not moving a lot, but he does not feel constipated. Asking for something to help with the pain. Please advise

## 2017-01-24 LAB — URINE CULTURE: Culture: NO GROWTH

## 2017-01-25 ENCOUNTER — Encounter
Admission: RE | Admit: 2017-01-25 | Discharge: 2017-01-25 | Disposition: A | Discharge status: Home / Self Care | Payer: Medicare HMO | Source: Ambulatory Visit | Attending: Hematology and Oncology | Admitting: Hematology and Oncology

## 2017-01-25 ENCOUNTER — Ambulatory Visit: Payer: Medicare HMO | Admitting: Hematology and Oncology

## 2017-01-25 ENCOUNTER — Ambulatory Visit
Admission: RE | Admit: 2017-01-25 | Discharge: 2017-01-25 | Disposition: A | Discharge status: Home / Self Care | Payer: Medicare HMO | Source: Ambulatory Visit | Attending: Hematology and Oncology | Admitting: Hematology and Oncology

## 2017-01-25 DIAGNOSIS — I700 Atherosclerosis of aorta: Secondary | ICD-10-CM | POA: Diagnosis not present

## 2017-01-25 DIAGNOSIS — R590 Localized enlarged lymph nodes: Secondary | ICD-10-CM | POA: Insufficient documentation

## 2017-01-25 DIAGNOSIS — K769 Liver disease, unspecified: Secondary | ICD-10-CM

## 2017-01-25 DIAGNOSIS — R918 Other nonspecific abnormal finding of lung field: Secondary | ICD-10-CM | POA: Diagnosis present

## 2017-01-25 DIAGNOSIS — R2689 Other abnormalities of gait and mobility: Secondary | ICD-10-CM | POA: Diagnosis not present

## 2017-01-25 DIAGNOSIS — I708 Atherosclerosis of other arteries: Secondary | ICD-10-CM | POA: Insufficient documentation

## 2017-01-25 LAB — GLUCOSE, CAPILLARY: Glucose-Capillary: 84 mg/dL (ref 65–99)

## 2017-01-25 MED ORDER — FLUDEOXYGLUCOSE F - 18 (FDG) INJECTION
13.1000 | Freq: Once | INTRAVENOUS | Status: AC | PRN
  Administered 2017-01-25: 13.1 via INTRAVENOUS

## 2017-01-27 ENCOUNTER — Inpatient Hospital Stay (HOSPITAL_BASED_OUTPATIENT_CLINIC_OR_DEPARTMENT_OTHER): Payer: Medicare HMO | Admitting: Hematology and Oncology

## 2017-01-27 ENCOUNTER — Other Ambulatory Visit: Payer: Self-pay

## 2017-01-27 ENCOUNTER — Encounter: Payer: Self-pay | Admitting: Emergency Medicine

## 2017-01-27 ENCOUNTER — Emergency Department
Admission: EM | Admit: 2017-01-27 | Discharge: 2017-01-27 | Disposition: A | Discharge status: Home / Self Care | Payer: Medicare HMO | Attending: Emergency Medicine | Admitting: Emergency Medicine

## 2017-01-27 ENCOUNTER — Encounter: Payer: Self-pay | Admitting: Hematology and Oncology

## 2017-01-27 VITALS — BP 161/69 | HR 106 | Temp 98.6°F | Resp 20

## 2017-01-27 DIAGNOSIS — R748 Abnormal levels of other serum enzymes: Secondary | ICD-10-CM

## 2017-01-27 DIAGNOSIS — M899 Disorder of bone, unspecified: Secondary | ICD-10-CM

## 2017-01-27 DIAGNOSIS — Z79899 Other long term (current) drug therapy: Secondary | ICD-10-CM | POA: Diagnosis not present

## 2017-01-27 DIAGNOSIS — N189 Chronic kidney disease, unspecified: Secondary | ICD-10-CM | POA: Diagnosis not present

## 2017-01-27 DIAGNOSIS — K5900 Constipation, unspecified: Secondary | ICD-10-CM | POA: Diagnosis not present

## 2017-01-27 DIAGNOSIS — K769 Liver disease, unspecified: Secondary | ICD-10-CM | POA: Diagnosis not present

## 2017-01-27 DIAGNOSIS — K5641 Fecal impaction: Secondary | ICD-10-CM | POA: Insufficient documentation

## 2017-01-27 DIAGNOSIS — Z85118 Personal history of other malignant neoplasm of bronchus and lung: Secondary | ICD-10-CM | POA: Insufficient documentation

## 2017-01-27 DIAGNOSIS — I129 Hypertensive chronic kidney disease with stage 1 through stage 4 chronic kidney disease, or unspecified chronic kidney disease: Secondary | ICD-10-CM

## 2017-01-27 DIAGNOSIS — C7951 Secondary malignant neoplasm of bone: Secondary | ICD-10-CM

## 2017-01-27 DIAGNOSIS — F17210 Nicotine dependence, cigarettes, uncomplicated: Secondary | ICD-10-CM

## 2017-01-27 DIAGNOSIS — R590 Localized enlarged lymph nodes: Secondary | ICD-10-CM | POA: Diagnosis not present

## 2017-01-27 DIAGNOSIS — I10 Essential (primary) hypertension: Secondary | ICD-10-CM | POA: Diagnosis not present

## 2017-01-27 DIAGNOSIS — K6289 Other specified diseases of anus and rectum: Secondary | ICD-10-CM | POA: Diagnosis not present

## 2017-01-27 DIAGNOSIS — M129 Arthropathy, unspecified: Secondary | ICD-10-CM

## 2017-01-27 DIAGNOSIS — R413 Other amnesia: Secondary | ICD-10-CM

## 2017-01-27 DIAGNOSIS — R918 Other nonspecific abnormal finding of lung field: Secondary | ICD-10-CM | POA: Diagnosis not present

## 2017-01-27 DIAGNOSIS — Z7982 Long term (current) use of aspirin: Secondary | ICD-10-CM | POA: Diagnosis not present

## 2017-01-27 DIAGNOSIS — F17200 Nicotine dependence, unspecified, uncomplicated: Secondary | ICD-10-CM | POA: Diagnosis not present

## 2017-01-27 DIAGNOSIS — C799 Secondary malignant neoplasm of unspecified site: Secondary | ICD-10-CM | POA: Insufficient documentation

## 2017-01-27 DIAGNOSIS — R1030 Lower abdominal pain, unspecified: Secondary | ICD-10-CM | POA: Diagnosis not present

## 2017-01-27 DIAGNOSIS — Z66 Do not resuscitate: Secondary | ICD-10-CM

## 2017-01-27 DIAGNOSIS — N429 Disorder of prostate, unspecified: Secondary | ICD-10-CM | POA: Diagnosis not present

## 2017-01-27 MED ORDER — POLYETHYLENE GLYCOL 3350 17 G PO PACK
17.0000 g | PACK | Freq: Every day | ORAL | Status: DC
  Administered 2017-01-27: 17 g via ORAL
  Filled 2017-01-27: qty 1

## 2017-01-27 MED ORDER — MINERAL OIL RE ENEM
1.0000 | ENEMA | Freq: Once | RECTAL | Status: AC
  Administered 2017-01-27: 1 via RECTAL

## 2017-01-27 MED ORDER — DOCUSATE SODIUM 50 MG/5ML PO LIQD
100.0000 mg | Freq: Once | ORAL | Status: AC
  Administered 2017-01-27: 100 mg via ORAL
  Filled 2017-01-27: qty 10

## 2017-01-27 MED ORDER — MAGNESIUM CITRATE PO SOLN
1.0000 | Freq: Once | ORAL | Status: AC
  Administered 2017-01-27: 1 via ORAL
  Filled 2017-01-27: qty 296

## 2017-01-27 MED ORDER — POLYETHYLENE GLYCOL 3350 17 G PO PACK: 17.0000 g | PACK | Freq: Every day | ORAL | 0 refills | Status: DC

## 2017-01-27 NOTE — ED Notes (Signed)
Pt was assisted to the toilet and pt was able to sit and use the bathroom. However pt began to give out and pt was assisted back to his feet and placed back in the bed. Pts heart rate did increase significantly while standing.

## 2017-01-27 NOTE — ED Triage Notes (Signed)
Pt seen today in cancer center. Dr. Mike Gip attempted to disimpact pt without success. Pt reports generalized abdominal pain.

## 2017-01-27 NOTE — Progress Notes (Signed)
[facility] day:  01/27/2017  Chief Complaint: Wayne Le is a 83 y.o. male with probable metastatic lung cancer who is seen for review of interval imaging studies.   HPI:  The patient was last seen in the medical oncology clinic on 01/18/2017.  At that time, he was seen for initial consultation.  He was felt to have probable metastatic lung cancer.  Imaging studies were scheduled.  We discussed obtaining tissue for diagnosis.  Patient contacted the clinic on 01/23/2017 with severe lower abdominal pain.  He was seen in the [facility] ER.  Abdominal films revealed similar to worsening RIGHT upper lobe interstitial prominence concerning for pneumonia.  Additional chronic changes in RIGHT lung base mass.  Stool distended rectum concerning for fecal impaction with moderate retained large bowel stool, nonobstructive bowel gas pattern.  Urinalysis was negative.  He was disimpacted with relief of his abdominal pain.  He was presbed azithromycin for possible pneumonia.  PET scan on 01/25/2017 revealed a dominant 6.3 x 5.7 cm right lower lobe lung mass which was hypermetabolic (SUV 9.6).  There were multiple hypermetabolic pulmonary nodules, mediastinal, right hilar and porta hepatis lymph nodes and extensive hepatic lesions consistent with stage IV lung cancer.  There was possible small osseous metastases within the left inferior pubic ramus and near the right seventh costovertebral junction.  Head MRI on 01/25/2017 revealed no acute intracranial abnormality.  There was stable moderate chronic microvascular ischemic changes and brain parenchymal volume loss.  Symptomatically, he has severe sharp rectal pain.  He has not had a bowel movement in a long time.  He felt slightly better after disimpaction on 01/23/2017.  He has had problems with constipation for a long time.  He has only used stool softeners.  He is not eating or drinking much because of lower abdominal  pain.  He denies any nausea, vomiting, or fever.   Past Medical History:  Diagnosis Date  . Arthritis   . Cancer ([place])   . Cataract   . Hypertension   . Memory changes   . Prostate disorder     Past Surgical History:  Procedure Laterality Date  . HERNIA REPAIR      History reviewed. No pertinent family history.  Social History:  reports that he has been smoking.  He has been smoking about 0.25 packs per day. He has never used smokeless tobacco. He reports that he does not drink alcohol or use drugs.  He has a 50+ pack year smoking history.  He began smoking as a young boy.  He initially smoked 1 pack a day.  He cut back 10 years ago.  A pack of cigarettes now lasts a week.  He previously drank a lot.  He stopped drinking 10 years ago.  He lives independently in [place].  The patient is accompanied by his 2 daughter, Wayne Le (works at [employer]) and Wayne Le"). today.  Allergies:  Allergies  Allergen Reactions  . Iodine Swelling  . Other Other (See Comments)  . Penicillins Hives and Rash    Current Medications: Current Outpatient Prescriptions  Medication Sig Dispense Refill  . aspirin EC 81 MG tablet Take 81 mg by mouth daily.    Marland Kitchen azithromycin (ZITHROMAX) 250 MG tablet 2 tablets today, then 1 tablet for the next 4 days. 6 each 0  . docusate sodium (COLACE) 50 MG capsule Take 50 mg by mouth daily.    Marland Kitchen dutasteride (AVODART) 0.5 MG capsule Take  1 capsule by mouth daily.    . finasteride (PROSCAR) 5 MG tablet Take 5 mg by mouth daily.    Marland Kitchen levofloxacin (LEVAQUIN) 250 MG tablet Take 1 tablet (250 mg total) by mouth daily. 4 tablet 0  . meloxicam (MOBIC) 15 MG tablet Take 1 tablet by mouth daily.    . mirtazapine (REMERON) 15 MG tablet Take 15 mg by mouth at bedtime.    Marland Kitchen NAMZARIC 28-10 MG CP24 Take 1 capsule by mouth daily.    . Omega 3 1000 MG CAPS Take 1,000 mg by mouth daily.     No current facility-administered medications for this visit.     Review of  Systems:  GENERAL:  Feels horrible.  No fevers or sweats.  No new weight. PERFORMANCE STATUS (ECOG):  1 HEENT:  Vision changes.  Runny nose.  No sore throat, mouth sores or tenderness. Lungs: No shortness of breath.  Chronic cough (no change).  No hemoptysis. Cardiac:  No chest pain, palpitations, orthopnea, or PND. GI:  No appetite.  Lower abdominal pain.  Severe constipation.  Disimpacted on 01/23/2017.  No nausea, vomiting, diarrhea, melena or hematochezia. GU:  No urgency, frequency, dysuria, or hematuria. Musculoskeletal:  Bilateral hip/upper thigh pain.  No back pain.  No joint pain.  No muscle tenderness. Extremities:  No pain or swelling. Skin:  No rashes or skin changes. Neuro:  No headache, numbness or weakness.  No dizziness.  Balance or coordination issues. No falls. Endocrine:  No diabetes, thyroid issues, hot flashes or night sweats. Psych:  No mood changes, depression or anxiety. Pain:  Rectal pain. Review of systems:  All other systems reviewed and found to be negative.  Physical Exam: Blood pressure (!) 161/69, pulse (!) 106, temperature 98.6 F (37 C), temperature source Tympanic, resp. rate 20. GENERAL: Thin elderly gentleman sitting in a wheelchair leaning off the side moaning in pain. MENTAL STATUS:  Alert and oriented to person, place and time. HEAD:  Gray curly hair.  Wayne Le.  Normocephalic, atraumatic, face symmetric, no Cushingoid features. EYES:  Brown eyes.  Pupils equal round and reactive to light and accomodation.  No conjunctivitis or scleral icterus. ENT:  Oropharynx clear without lesion.  Edentulous.  Tongue normal. Mucous membranes moist.  RESPIRATORY:  Decreased breath sounds RLL.  Otherwise, clear to auscultation without rales, wheezes or rhonchi. CARDIOVASCULAR:  Regular rate and rhythm without murmur, rub or gallop. ABDOMEN:  Soft, tender lower abdomen without guarding or rebound tenderness.  Active bowel sounds and no splenomegaly.  Liver palpable  medially below RCM.  No masses. RECTUM:  Brown stool on outside of rectum.  Hard column of stool palpated on digital rectal exam. SKIN:  No rashes, ulcers or lesions. EXTREMITIES: No edema, no skin discoloration or tenderness.  No palpable cords. LYMPH NODES: No palpable cervical, supraclavicular, axillary or inguinal adenopathy. NEURO: Appropriate. PSYCH:  Appropriate.   Hospital Outpatient Visit on 01/25/2017  Component Date Value Ref Range Status  . Glucose-Capillary 01/25/2017 84  65 - 99 mg/dL Final    Assessment:  Wayne Le is a 83 y.o. male with imaging studies worrisome for metastatic lung cancer.  He has a 50+ pack year smoking history.    Chest and abdomen CT scan on 01/15/2017 revealed masslike consolidation the right lower lobe with scattered bilateral discrete nodular opacities.  There was bilateral hilar and mediastinal adenopathy.  There was aspirate material within the trachea and right lower lobe bronchi causing right lower lobe bronchial obstruction.  There were multiple low-density liver lesions (largest 2.6 cm over the dome of the liver) compatible with metastatic disease.   PET scan on 01/25/2017 revealed a dominant 6.3 x 5.7 cm right lower lobe lung mass which was hypermetabolic (SUV 9.6).  There were multiple hypermetabolic pulmonary nodules, mediastinal, right hilar and porta hepatis lymph nodes and extensive hepatic lesions consistent with stage IV lung cancer.  There was possible small osseous metastases within the left inferior pubic ramus and near the right seventh costovertebral junction.  Head MRI on 01/25/2017 revealed no acute intracranial abnormality.   He has chronic renal insufficiency.  Alkaline phosphatase was elevated (180) on 01/15/2017.  Symptomatically, he has severe constipation unrelieved by disimpaction on 01/23/2017.   Code status is DNR/DNI.  Plan: 1.  Review PET scan and head MRI.  Discuss extensive metastatic disease likely secondary to  lung cancer.  Metastatic disease is present in the lymph nodes, lung, liver, and bone.  Discuss biopsy.  Images reviewed with radiology.  Recommendation was for ultrasound guided liver biopsy.  Patient would like to proceed with biopsy.  Discuss plan for biopsy next week once constipation resolved. 2.  Discuss referral to ER for disimpaction and enemas to relieve severe abdominal pain and excess stool.  Discuss bowel regimen including Miralax. 3.  Ultrasound guided liver biopsy next week 4.  RTC for MD assessment 3 days after biopsy to discuss direction of therapy.   Lequita Asal, MD  01/27/2017, 8:59 AM

## 2017-01-27 NOTE — Progress Notes (Signed)
Patient went to ED on Monday for bowel impaction.  Patient has not had BM since then.  Patient presents today in wheelchair, in severe pain in his rectum.  States he also was diagnosed with pneumonia and was placed on azithromycin.  Here today for scan results.

## 2017-01-27 NOTE — ED Provider Notes (Signed)
Signout from Dr. Owens Shark in this 83 year old man who is presenting with constipation today. Disimpacted by Dr. Princess Perna receiving an enema. Plan is to follow-up for resolution of symptoms.  Physical Exam  BP (!) 148/93 (BP Location: Left Arm)   Pulse (!) 104   Resp 18   Ht '5\' 9"'$  (1.753 m)   Wt 160 lb (72.6 kg)   SpO2 97%   BMI 23.63 kg/m  ----------------------------------------- 12:47 PM on 01/27/2017 -----------------------------------------   Physical Exam Patient without any distress. Says that he also feels subjectively better. ED Course  Procedures  MDM Patient denying any abdominal pain at this time. On the monitor the patient appears to have an irregularly irregular rhythm and is intermittently tachycardic.  ED ECG REPORT I, Doran Stabler, the attending physician, personally viewed and interpreted this ECG.   Date: 01/27/2017  EKG Time: 0948  Rate: 87  Rhythm: normal sinus rhythm with PACs.  Axis: normal  Intervals:none  ST&T Change: No ST segment elevation or depression. No abnormal T-wave inversion.   Appears to be in a sinus rhythm with PACs.  Patient to follow-up with his cardiologist. Will be discharged home with Colace at this time.    Orbie Pyo, MD 01/27/17 [phone]

## 2017-01-27 NOTE — ED Provider Notes (Signed)
[facility] Emergency Department Provider Note   First MD Initiated Contact with Patient 01/27/17 [phone]     (approximate)  I have reviewed the triage vital signs and the nursing notes.   HISTORY  Chief Complaint Fecal Impaction    HPI Wayne Le is a 83 y.o. male with history of lung cancer as well as below listed medical conditions presents to the emergency department with constipation times approximately one week. Patient was seen at the cancer center today at which point Dr. Susy Manor did a rectal exam and attempted a disimpaction was unsuccessful. Patient denies any abdominal pain at this time. Patient denies any fever or vomiting.   Past Medical History:  Diagnosis Date  . Arthritis   . Cancer ([place])   . Cataract   . Hypertension   . Memory changes   . Prostate disorder     Patient Active Problem List   Diagnosis Date Noted  . Bone metastasis ([place]) 01/27/2017  . Right lower lobe lung mass 01/18/2017  . Mediastinal adenopathy 01/18/2017  . Liver lesion 01/18/2017  . Balance problems 01/18/2017  . Sepsis ([place]) 08/24/2016  . Acute gastroenteritis 08/24/2016  . Community acquired pneumonia 08/24/2016  . Acute renal insufficiency 08/24/2016  . Hyperbilirubinemia 08/24/2016  . Elevated troponin 08/24/2016    Past Surgical History:  Procedure Laterality Date  . HERNIA REPAIR      Prior to Admission medications   Medication Sig Start Date End Date Taking? Authorizing Provider  aspirin EC 81 MG tablet Take 81 mg by mouth daily.    Historical Provider, MD  azithromycin (ZITHROMAX) 250 MG tablet 2 tablets today, then 1 tablet for the next 4 days. 01/23/17   Victorino Dike, FNP  docusate sodium (COLACE) 50 MG capsule Take 50 mg by mouth daily.    Historical Provider, MD  dutasteride (AVODART) 0.5 MG capsule Take 1 capsule by mouth daily. 08/08/16   Historical Provider, MD  finasteride (PROSCAR) 5 MG tablet Take 5 mg by mouth daily.     Historical Provider, MD  levofloxacin (LEVAQUIN) 250 MG tablet Take 1 tablet (250 mg total) by mouth daily. 08/25/16   Fritzi Mandes, MD  meloxicam (MOBIC) 15 MG tablet Take 1 tablet by mouth daily. 08/03/16   Historical Provider, MD  mirtazapine (REMERON) 15 MG tablet Take 15 mg by mouth at bedtime.    Historical Provider, MD  NAMZARIC 28-10 MG CP24 Take 1 capsule by mouth daily. 07/25/16   Historical Provider, MD  Omega 3 1000 MG CAPS Take 1,000 mg by mouth daily.    Historical Provider, MD    Allergies Iodine; Other; and Penicillins  No family history on file.  Social History Social History  Substance Use Topics  . Smoking status: Current Every Day Smoker    Packs/day: 0.25  . Smokeless tobacco: Never Used     Comment: Pack lasts one week  . Alcohol use No    Review of Systems Constitutional: No fever/chills Eyes: No visual changes. ENT: No sore throat. Cardiovascular: Denies chest pain. Respiratory: Denies shortness of breath. Gastrointestinal: No abdominal pain.  No nausea, no vomiting.  No diarrhea.  Positive for constipation. Genitourinary: Negative for dysuria. Musculoskeletal: Negative for back pain. Skin: Negative for rash. Neurological: Negative for headaches, focal weakness or numbness.  10-point ROS otherwise negative.  ____________________________________________   PHYSICAL EXAM:  VITAL SIGNS: ED Triage Vitals  Enc Vitals Group     BP 01/27/17 0944 139/63  Pulse Rate 01/27/17 0944 95     Resp 01/27/17 0944 20     Temp --      Temp src --      SpO2 01/27/17 0944 95 %     Weight 01/27/17 0942 160 lb (72.6 kg)     Height 01/27/17 0942 '5\' 9"'$  (1.753 m)     Head Circumference --      Peak Flow --      Pain Score 01/27/17 0942 10     Pain Loc --      Pain Edu? --      Excl. in [place]? --     Constitutional: Alert and oriented. Well appearing and in no acute distress. Eyes: Conjunctivae are normal. PERRL. EOMI. Head: Atraumatic. Ears:  Healthy appearing  ear canals and TMs bilaterally Nose: No congestion/rhinnorhea. Mouth/Throat: Mucous membranes are moist.  Oropharynx non-erythematous. Neck: No stridor. Cardiovascular: Normal rate, regular rhythm. Good peripheral circulation. Grossly normal heart sounds. Respiratory: Normal respiratory effort.  No retractions. Lungs CTAB. Gastrointestinal: Soft and nontender. No distention.  Musculoskeletal: No lower extremity tenderness nor edema. No gross deformities of extremities. Neurologic:  Normal speech and language. No gross focal neurologic deficits are appreciated.  Skin:  Skin is warm, dry and intact. No rash noted.    PROCEDURES  Fecal disimpaction Date/Time: 01/27/2017 10:58 AM Performed by: Gregor Hams Authorized by: Gregor Hams  Consent: Verbal consent obtained. Consent given by: patient Patient understanding: patient states understanding of the procedure being performed Imaging studies: imaging studies available Patient identity confirmed: verbally with patient and arm band Preparation: Patient was prepped and draped in the usual sterile fashion. Local anesthesia used: no  Anesthesia: Local anesthesia used: no  Sedation: Patient sedated: no Patient tolerance: Patient tolerated the procedure well with no immediate complications      INITIAL IMPRESSION / ASSESSMENT AND PLAN / ED COURSE  Pertinent labs & imaging results that were available during my care of the patient were reviewed by me and considered in my medical decision making (see chart for details).  Patient also given a soapsuds enema as well as MiraLAX following manual disimpaction      ____________________________________________  FINAL CLINICAL IMPRESSION(S) / ED DIAGNOSES  Final diagnoses:  Fecal impaction ([place])     MEDICATIONS GIVEN DURING THIS VISIT:  Medications  mineral oil enema 1 enema (not administered)  docusate (COLACE) 50 MG/5ML liquid 100 mg (not administered)  magnesium  citrate solution 1 Bottle (not administered)  polyethylene glycol (MIRALAX / GLYCOLAX) packet 17 g (not administered)     NEW OUTPATIENT MEDICATIONS STARTED DURING THIS VISIT:  New Prescriptions   No medications on file    Modified Medications   No medications on file    Discontinued Medications   No medications on file     Note:  This document was prepared using Dragon voice recognition software and may include unintentional dictation errors.    Gregor Hams, MD 01/27/17 1059

## 2017-01-27 NOTE — ED Notes (Signed)
Pt placed on bedpan

## 2017-01-28 ENCOUNTER — Other Ambulatory Visit: Payer: Self-pay

## 2017-01-28 ENCOUNTER — Emergency Department: Payer: Medicare HMO

## 2017-01-28 ENCOUNTER — Observation Stay
Admission: EM | Admit: 2017-01-28 | Discharge: 2017-01-31 | Disposition: A | Discharge status: Home / Self Care | Payer: Medicare HMO | Attending: Internal Medicine | Admitting: Internal Medicine

## 2017-01-28 DIAGNOSIS — F17210 Nicotine dependence, cigarettes, uncomplicated: Secondary | ICD-10-CM | POA: Insufficient documentation

## 2017-01-28 DIAGNOSIS — R1031 Right lower quadrant pain: Secondary | ICD-10-CM

## 2017-01-28 DIAGNOSIS — Z79899 Other long term (current) drug therapy: Secondary | ICD-10-CM | POA: Insufficient documentation

## 2017-01-28 DIAGNOSIS — E43 Unspecified severe protein-calorie malnutrition: Secondary | ICD-10-CM | POA: Insufficient documentation

## 2017-01-28 DIAGNOSIS — C7951 Secondary malignant neoplasm of bone: Secondary | ICD-10-CM | POA: Diagnosis not present

## 2017-01-28 DIAGNOSIS — E039 Hypothyroidism, unspecified: Secondary | ICD-10-CM | POA: Insufficient documentation

## 2017-01-28 DIAGNOSIS — I517 Cardiomegaly: Secondary | ICD-10-CM | POA: Insufficient documentation

## 2017-01-28 DIAGNOSIS — K5641 Fecal impaction: Secondary | ICD-10-CM | POA: Diagnosis not present

## 2017-01-28 DIAGNOSIS — I10 Essential (primary) hypertension: Secondary | ICD-10-CM | POA: Diagnosis present

## 2017-01-28 DIAGNOSIS — Z7982 Long term (current) use of aspirin: Secondary | ICD-10-CM | POA: Diagnosis not present

## 2017-01-28 DIAGNOSIS — C7801 Secondary malignant neoplasm of right lung: Secondary | ICD-10-CM | POA: Diagnosis not present

## 2017-01-28 DIAGNOSIS — K5900 Constipation, unspecified: Secondary | ICD-10-CM | POA: Insufficient documentation

## 2017-01-28 DIAGNOSIS — C787 Secondary malignant neoplasm of liver and intrahepatic bile duct: Secondary | ICD-10-CM | POA: Diagnosis not present

## 2017-01-28 DIAGNOSIS — C779 Secondary and unspecified malignant neoplasm of lymph node, unspecified: Secondary | ICD-10-CM | POA: Diagnosis not present

## 2017-01-28 DIAGNOSIS — I441 Atrioventricular block, second degree: Secondary | ICD-10-CM | POA: Insufficient documentation

## 2017-01-28 DIAGNOSIS — Z91041 Radiographic dye allergy status: Secondary | ICD-10-CM | POA: Insufficient documentation

## 2017-01-28 DIAGNOSIS — Z880 Allergy status to penicillin: Secondary | ICD-10-CM | POA: Diagnosis not present

## 2017-01-28 DIAGNOSIS — I4891 Unspecified atrial fibrillation: Secondary | ICD-10-CM | POA: Diagnosis present

## 2017-01-28 DIAGNOSIS — I482 Chronic atrial fibrillation: Secondary | ICD-10-CM | POA: Diagnosis not present

## 2017-01-28 DIAGNOSIS — I700 Atherosclerosis of aorta: Secondary | ICD-10-CM | POA: Insufficient documentation

## 2017-01-28 DIAGNOSIS — Z791 Long term (current) use of non-steroidal anti-inflammatories (NSAID): Secondary | ICD-10-CM | POA: Insufficient documentation

## 2017-01-28 DIAGNOSIS — R531 Weakness: Secondary | ICD-10-CM | POA: Diagnosis not present

## 2017-01-28 DIAGNOSIS — F0390 Unspecified dementia without behavioral disturbance: Secondary | ICD-10-CM | POA: Diagnosis not present

## 2017-01-28 DIAGNOSIS — Z955 Presence of coronary angioplasty implant and graft: Secondary | ICD-10-CM | POA: Diagnosis not present

## 2017-01-28 DIAGNOSIS — I672 Cerebral atherosclerosis: Secondary | ICD-10-CM | POA: Diagnosis not present

## 2017-01-28 DIAGNOSIS — J479 Bronchiectasis, uncomplicated: Secondary | ICD-10-CM | POA: Diagnosis not present

## 2017-01-28 DIAGNOSIS — N179 Acute kidney failure, unspecified: Secondary | ICD-10-CM | POA: Diagnosis not present

## 2017-01-28 DIAGNOSIS — J84112 Idiopathic pulmonary fibrosis: Secondary | ICD-10-CM | POA: Insufficient documentation

## 2017-01-28 DIAGNOSIS — E860 Dehydration: Secondary | ICD-10-CM | POA: Diagnosis not present

## 2017-01-28 DIAGNOSIS — C7802 Secondary malignant neoplasm of left lung: Secondary | ICD-10-CM | POA: Diagnosis not present

## 2017-01-28 DIAGNOSIS — J9809 Other diseases of bronchus, not elsewhere classified: Secondary | ICD-10-CM | POA: Insufficient documentation

## 2017-01-28 DIAGNOSIS — Z66 Do not resuscitate: Secondary | ICD-10-CM | POA: Insufficient documentation

## 2017-01-28 DIAGNOSIS — I480 Paroxysmal atrial fibrillation: Secondary | ICD-10-CM | POA: Insufficient documentation

## 2017-01-28 DIAGNOSIS — J439 Emphysema, unspecified: Secondary | ICD-10-CM | POA: Insufficient documentation

## 2017-01-28 DIAGNOSIS — N281 Cyst of kidney, acquired: Secondary | ICD-10-CM | POA: Insufficient documentation

## 2017-01-28 DIAGNOSIS — I708 Atherosclerosis of other arteries: Secondary | ICD-10-CM | POA: Insufficient documentation

## 2017-01-28 DIAGNOSIS — M954 Acquired deformity of chest and rib: Secondary | ICD-10-CM | POA: Insufficient documentation

## 2017-01-28 DIAGNOSIS — E875 Hyperkalemia: Secondary | ICD-10-CM | POA: Insufficient documentation

## 2017-01-28 DIAGNOSIS — M1990 Unspecified osteoarthritis, unspecified site: Secondary | ICD-10-CM | POA: Insufficient documentation

## 2017-01-28 LAB — URINALYSIS, COMPLETE (UACMP) WITH MICROSCOPIC
BILIRUBIN URINE: NEGATIVE
Glucose, UA: NEGATIVE mg/dL
Ketones, ur: NEGATIVE mg/dL
Leukocytes, UA: NEGATIVE
Nitrite: NEGATIVE
PH: 5 (ref 5.0–8.0)
Protein, ur: 30 mg/dL — AB
SPECIFIC GRAVITY, URINE: 1.024 (ref 1.005–1.030)

## 2017-01-28 LAB — COMPREHENSIVE METABOLIC PANEL
ALBUMIN: 3 g/dL — AB (ref 3.5–5.0)
ALT: 70 U/L — ABNORMAL HIGH (ref 17–63)
ANION GAP: 8 (ref 5–15)
AST: 107 U/L — ABNORMAL HIGH (ref 15–41)
Alkaline Phosphatase: 152 U/L — ABNORMAL HIGH (ref 38–126)
BILIRUBIN TOTAL: 1.2 mg/dL (ref 0.3–1.2)
BUN: 22 mg/dL — ABNORMAL HIGH (ref 6–20)
CHLORIDE: 106 mmol/L (ref 101–111)
CO2: 22 mmol/L (ref 22–32)
Calcium: 9.4 mg/dL (ref 8.9–10.3)
Creatinine, Ser: 1.18 mg/dL (ref 0.61–1.24)
GFR calc Af Amer: >60 mL/min (ref >60)
GFR calc non Af Amer: 54 mL/min — ABNORMAL LOW (ref >60)
GLUCOSE: 99 mg/dL (ref 65–99)
POTASSIUM: 5.1 mmol/L (ref 3.5–5.1)
SODIUM: 136 mmol/L (ref 135–145)
TOTAL PROTEIN: 7.7 g/dL (ref 6.5–8.1)

## 2017-01-28 LAB — TSH: TSH: 10.482 u[IU]/mL — ABNORMAL HIGH (ref 0.350–4.500)

## 2017-01-28 LAB — CBC WITH DIFFERENTIAL/PLATELET
BASOS ABS: 0.1 10*3/uL (ref 0–0.1)
Basophils Relative: 1 %
Eosinophils Absolute: 0.1 10*3/uL (ref 0–0.7)
Eosinophils Relative: 2 %
HEMATOCRIT: 47.5 % (ref 40.0–52.0)
Hemoglobin: 15.5 g/dL (ref 13.0–18.0)
LYMPHS PCT: 16 %
Lymphs Abs: 1 10*3/uL (ref 1.0–3.6)
MCH: 24 pg — ABNORMAL LOW (ref 26.0–34.0)
MCHC: 32.7 g/dL (ref 32.0–36.0)
MCV: 73.5 fL — ABNORMAL LOW (ref 80.0–100.0)
MONOS PCT: 12 %
Monocytes Absolute: 0.7 10*3/uL (ref 0.2–1.0)
NEUTROS ABS: 4.2 10*3/uL (ref 1.4–6.5)
Neutrophils Relative %: 69 %
Platelets: 136 10*3/uL — ABNORMAL LOW (ref 150–440)
RBC: 6.46 MIL/uL — AB (ref 4.40–5.90)
RDW: 15 % — ABNORMAL HIGH (ref 11.5–14.5)
WBC: 6.1 10*3/uL (ref 3.8–10.6)

## 2017-01-28 LAB — TROPONIN I

## 2017-01-28 LAB — T4, FREE: Free T4: 1.23 ng/dL — ABNORMAL HIGH (ref 0.61–1.12)

## 2017-01-28 MED ORDER — KETOROLAC TROMETHAMINE 15 MG/ML IJ SOLN: 15.0000 mg | Freq: Four times a day (QID) | INTRAMUSCULAR | Status: DC | PRN

## 2017-01-28 MED ORDER — POLYETHYLENE GLYCOL 3350 17 G PO PACK
17.0000 g | PACK | Freq: Every day | ORAL | Status: DC
  Administered 2017-01-29 – 2017-01-31 (×3): 17 g via ORAL
  Filled 2017-01-28 (×3): qty 1

## 2017-01-28 MED ORDER — ASPIRIN EC 81 MG PO TBEC
81.0000 mg | DELAYED_RELEASE_TABLET | Freq: Every day | ORAL | Status: DC
  Administered 2017-01-29 – 2017-01-31 (×3): 81 mg via ORAL
  Filled 2017-01-28 (×3): qty 1

## 2017-01-28 MED ORDER — DUTASTERIDE 0.5 MG PO CAPS
0.5000 mg | ORAL_CAPSULE | Freq: Every day | ORAL | Status: DC
  Administered 2017-01-29 – 2017-01-31 (×3): 0.5 mg via ORAL
  Filled 2017-01-28 (×3): qty 1

## 2017-01-28 MED ORDER — KETOROLAC TROMETHAMINE 30 MG/ML IJ SOLN
15.0000 mg | Freq: Once | INTRAMUSCULAR | Status: AC
  Administered 2017-01-28: 15 mg via INTRAVENOUS
  Filled 2017-01-28: qty 1

## 2017-01-28 MED ORDER — SODIUM CHLORIDE 0.9 % IV SOLN
INTRAVENOUS | Status: DC
  Administered 2017-01-29: via INTRAVENOUS

## 2017-01-28 MED ORDER — FLEET ENEMA 7-19 GM/118ML RE ENEM: 1.0000 | ENEMA | Freq: Once | RECTAL | Status: DC | PRN

## 2017-01-28 MED ORDER — MIRTAZAPINE 15 MG PO TABS
15.0000 mg | ORAL_TABLET | Freq: Every day | ORAL | Status: DC
  Administered 2017-01-29 – 2017-01-30 (×3): 15 mg via ORAL
  Filled 2017-01-28 (×3): qty 1

## 2017-01-28 MED ORDER — KETOROLAC TROMETHAMINE 15 MG/ML IJ SOLN
15.0000 mg | Freq: Four times a day (QID) | INTRAMUSCULAR | Status: DC | PRN
  Administered 2017-01-29: 15 mg via INTRAVENOUS
  Filled 2017-01-28 (×2): qty 1

## 2017-01-28 MED ORDER — SORBITOL 70 % SOLN
960.0000 mL | TOPICAL_OIL | Freq: Once | ORAL | Status: AC
  Administered 2017-01-28: 960 mL via RECTAL
  Filled 2017-01-28: qty 240

## 2017-01-28 MED ORDER — MEMANTINE HCL-DONEPEZIL HCL ER 28-10 MG PO CP24: 1.0000 | ORAL_CAPSULE | Freq: Every day | ORAL | Status: DC

## 2017-01-28 MED ORDER — ACETAMINOPHEN 650 MG RE SUPP: 650.0000 mg | Freq: Four times a day (QID) | RECTAL | Status: DC | PRN

## 2017-01-28 MED ORDER — BISACODYL 10 MG RE SUPP
10.0000 mg | Freq: Every day | RECTAL | Status: DC | PRN
  Administered 2017-01-29: 10 mg via RECTAL
  Filled 2017-01-28: qty 1

## 2017-01-28 MED ORDER — MEMANTINE HCL ER 14 MG PO CP24
28.0000 mg | ORAL_CAPSULE | Freq: Every day | ORAL | Status: DC
  Administered 2017-01-29 – 2017-01-31 (×3): 28 mg via ORAL
  Filled 2017-01-28 (×3): qty 2

## 2017-01-28 MED ORDER — ONDANSETRON HCL 4 MG PO TABS: 4.0000 mg | ORAL_TABLET | Freq: Four times a day (QID) | ORAL | Status: DC | PRN

## 2017-01-28 MED ORDER — ENOXAPARIN SODIUM 40 MG/0.4ML ~~LOC~~ SOLN
40.0000 mg | SUBCUTANEOUS | Status: DC
  Administered 2017-01-29 – 2017-01-30 (×2): 40 mg via SUBCUTANEOUS
  Filled 2017-01-28 (×2): qty 0.4

## 2017-01-28 MED ORDER — ACETAMINOPHEN 325 MG PO TABS: 650.0000 mg | ORAL_TABLET | Freq: Four times a day (QID) | ORAL | Status: DC | PRN

## 2017-01-28 MED ORDER — DOCUSATE SODIUM 50 MG PO CAPS
50.0000 mg | ORAL_CAPSULE | Freq: Every day | ORAL | Status: DC
  Filled 2017-01-28: qty 1

## 2017-01-28 MED ORDER — SODIUM CHLORIDE 0.9% FLUSH
3.0000 mL | Freq: Two times a day (BID) | INTRAVENOUS | Status: DC
  Administered 2017-01-29 – 2017-01-30 (×2): 3 mL via INTRAVENOUS

## 2017-01-28 MED ORDER — DONEPEZIL HCL 5 MG PO TABS
10.0000 mg | ORAL_TABLET | Freq: Every day | ORAL | Status: DC
  Administered 2017-01-29 – 2017-01-31 (×3): 10 mg via ORAL
  Filled 2017-01-28 (×3): qty 2

## 2017-01-28 MED ORDER — ONDANSETRON HCL 4 MG/2ML IJ SOLN: 4.0000 mg | Freq: Four times a day (QID) | INTRAMUSCULAR | Status: DC | PRN

## 2017-01-28 NOTE — ED Triage Notes (Signed)
Pt reports to ED w/ c/o constipation.  Pts family sts that this is the 4th time that he's been in ED w/ constipation.  Family reports that pt has been taking stool softeners and laxatives w/o success. Pts family reports that he has been disimpacted twice in ED.

## 2017-01-28 NOTE — ED Notes (Signed)
Lab states they needed a new green top for the T3, T4, this RN will redraw and send.

## 2017-01-28 NOTE — ED Notes (Signed)
Patient transported to X-ray 

## 2017-01-28 NOTE — ED Notes (Signed)
Pt with stool on his fingers to both hands, pt states "i've been using my finger to get it out" pt's catheter is disconnected and draining into the bed. Stool on bed sheets and covers

## 2017-01-28 NOTE — ED Notes (Signed)
Foley d/c'd intact without difficulty, order obtained from Dr Marcelene Butte to remove

## 2017-01-28 NOTE — ED Provider Notes (Signed)
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 Moderate stool in the rectum (series 3/ image 74), suggesting mild rectal impaction. Vascular/Lymphatic: No evidence of abdominal aortic aneurysm. Atherosclerotic calcifications of the abdominal aorta and branch vessels. Suspected mildly prominent lymph nodes in the porta hepatis, although poorly evaluated on unenhanced CT. Reproductive: Prostate is grossly unremarkable. Other: No abdominopelvic ascites. Musculoskeletal: 10 mm lytic lesion in the posterior left inferior pubic ramus (series 3/ image 88), worrisome for osseous metastasis. IMPRESSION: No findings to account for the patient's right lower quadrant pain. Moderate stool in the rectum, suggesting mild rectal impaction. Right lower lobe mass, suggesting primary bronchogenic neoplasm, better evaluated on recent PET-CT. Bilateral pulmonary metastases, incompletely visualized. Innumerable hepatic metastases. Suspected nodal metastases in the porta hepatis are better evaluated on recent PET-CT. 10 mm lytic metastasis in the posterior left inferior pubic ramus. Additional ancillary findings as above. Electronically Signed   By: Julian Hy M.D.   On: 01/28/2017  19:15   US Abdomen Limited Ruq  Result Date: 01/15/2017 CLINICAL DATA:  Elevated liver function tests. EXAM: US ABDOMEN LIMITED - RIGHT UPPER QUADRANT COMPARISON:  Current abdomen radiographs. Previous abdomen and pelvis CT, 05/16/2013. FINDINGS: Gallbladder: Not well distended, but no evidence of stones or wall thickening. Common bile duct: Diameter: 3 mm Liver: Liver is borderline enlarged. There are numerous masses throughout the liver. Largest discrete mass, primarily hypoechoic, arises from the posterior right lobe measuring 6.4 hepatopetal flow documented in the portal vein. Multiple other lesions have a target appearance with a peripheral rim of lower echogenicity and central increased echogenicity. Incidental note is made of a 7.8 cm right renal cyst arising from the lower pole, which was present on the prior CT. IMPRESSION: 1. Numerous liver masses are seen extending throughout the liver with borderline hepatomegaly. Findings are consistent with widespread hepatic metastatic disease. Recommend follow-up CT scan of the chest, abdomen and pelvis to assess for a primary malignancy. This would be best performed with oral and intravenous contrast if this patient can tolerate contrast. 2. No acute findings. No gallstones or evidence of acute cholecystitis. No bile duct dilation. Electronically Signed   By: Lajean Manes M.D.   On: 01/15/2017 18:07  "  I personally reviewed the radiologic studies Previous studies and readings were also reviewed  Procedures:  Digital disimpaction with 2 separate enemas    ED Course: Patient has some improvement after digital disimpaction along with 2 separate enemas here in emergency department. He still seems to be a large amount of discomfort and the family has poor resources at home to take care of the patient. He has new-onset atrial fibrillation but no indications of ischemic bowel disease at this time. He does not appear to have a bowel obstruction. Patient  was found to have hypothyroidism and has obvious metastatic lung cancer.     Final Clinical Impression:  Final diagnoses:  Right lower quadrant abdominal pain  Constipation, unspecified constipation type  Paroxysmal atrial fibrillation (HCC)  Hypothyroidism, unspecified type     Plan: Inpatient management            Daymon Larsen, MD 01/28/17 2101

## 2017-01-28 NOTE — ED Notes (Signed)
Patient toileted, cleaned, and redressed.

## 2017-01-28 NOTE — H&P (Signed)
[place] [facility] [place] NAME: Wayne Le    MR#:  [id number]  DATE OF BIRTH:  1927/08/11  DATE OF ADMISSION:  01/28/2017  PRIMARY CARE PHYSICIAN: Marden Noble, MD   REQUESTING/REFERRING PHYSICIAN: Marcelene Butte, MD  CHIEF COMPLAINT:   Chief Complaint  Patient presents with  . Constipation    HISTORY OF PRESENT ILLNESS:  Wayne Le  is a 83 y.o. male who presents with constipation. in the ed the patient was found to have fecal impaction.  He was disimpacted manually some, but was unable to be completely disimpacted by that method. He had multiple enemas given, but has still not cleared all of his impaction. The ED multiple times recently for the same problem. He is significant only dehydrated on clinical exam. Hospitalists were called for admission and further treatment.  PAST MEDICAL HISTORY:   Past Medical History:  Diagnosis Date  . Arthritis   . Cancer ([place])   . Cataract   . Hypertension   . Memory changes   . Prostate disorder     PAST SURGICAL HISTORY:   Past Surgical History:  Procedure Laterality Date  . HERNIA REPAIR      SOCIAL HISTORY:   Social History  Substance Use Topics  . Smoking status: Current Every Day Smoker    Packs/day: 0.25  . Smokeless tobacco: Never Used     Comment: Pack lasts one week  . Alcohol use No    FAMILY HISTORY:   Family History  Problem Relation Age of Onset  . Cancer Brother     DRUG ALLERGIES:   Allergies  Allergen Reactions  . Iodine Swelling  . Other Other (See Comments)  . Penicillins Hives and Rash    MEDICATIONS AT HOME:   Prior to Admission medications   Medication Sig Start Date End Date Taking? Authorizing Provider  docusate sodium (COLACE) 50 MG capsule Take 50 mg by mouth daily.   Yes Historical Provider, MD  dutasteride (AVODART) 0.5 MG capsule Take 1 capsule by mouth daily. 08/08/16  Yes Historical Provider, MD  meloxicam (MOBIC) 15 MG tablet  Take 1 tablet by mouth daily. 08/03/16  Yes Historical Provider, MD  mirtazapine (REMERON) 15 MG tablet Take 15 mg by mouth at bedtime.   Yes Historical Provider, MD  NAMZARIC 28-10 MG CP24 Take 1 capsule by mouth daily. 07/25/16  Yes Historical Provider, MD  Omega 3 1000 MG CAPS Take 1,000 mg by mouth daily.   Yes Historical Provider, MD  polyethylene glycol (MIRALAX) packet Take 17 g by mouth daily. 01/27/17  Yes Gregor Hams, MD  aspirin EC 81 MG tablet Take 81 mg by mouth daily.    Historical Provider, MD    REVIEW OF SYSTEMS:  Review of Systems  Constitutional: Negative for chills, fever, malaise/fatigue and weight loss.  HENT: Negative for ear pain, hearing loss and tinnitus.   Eyes: Negative for blurred vision, double vision, pain and redness.  Respiratory: Negative for cough, hemoptysis and shortness of breath.   Cardiovascular: Negative for chest pain, palpitations, orthopnea and leg swelling.  Gastrointestinal: Positive for constipation. Negative for abdominal pain, diarrhea, nausea and vomiting.  Genitourinary: Negative for dysuria, frequency and hematuria.  Musculoskeletal: Negative for back pain, joint pain and neck pain.  Skin:       No acne, rash, or lesions  Neurological: Negative for dizziness, tremors, focal weakness and weakness.  Endo/Heme/Allergies: Negative for polydipsia. Does not bruise/bleed easily.  Psychiatric/Behavioral:  Negative for depression. The patient is not nervous/anxious and does not have insomnia.      VITAL SIGNS:   Vitals:   01/28/17 1126 01/28/17 1530 01/28/17 1630  BP: 126/87 (!) 143/97 (!) 159/71  Pulse: 96 97 89  Resp: 20 (!) 23 (!) 27  Temp: 98.2 F (36.8 C)    TempSrc: Axillary    SpO2: 97% 95% 93%  Weight: 72.6 kg (160 lb)    Height: '5\' 9"'$  (1.753 m)     Wt Readings from Last 3 Encounters:  01/28/17 72.6 kg (160 lb)  01/27/17 72.6 kg (160 lb)  01/23/17 72.6 kg (160 lb)    PHYSICAL EXAMINATION:  Physical Exam  Vitals  reviewed. Constitutional: He is oriented to person, place, and time. He appears well-developed and well-nourished. No distress.  HENT:  Head: Normocephalic and atraumatic.  dry mucous membranes  Eyes: Conjunctivae and EOM are normal. Pupils are equal, round, and reactive to light. No scleral icterus.  Neck: Normal range of motion. Neck supple. No JVD present. No thyromegaly present.  Cardiovascular: Normal rate, regular rhythm and intact distal pulses.  Exam reveals no gallop and no friction rub.   No murmur heard. Respiratory: Effort normal and breath sounds normal. No respiratory distress. He has no wheezes. He has no rales.  GI: Soft. Bowel sounds are normal. He exhibits no distension. There is tenderness.  Musculoskeletal: Normal range of motion. He exhibits no edema.  No arthritis, no gout  Lymphadenopathy:    He has no cervical adenopathy.  Neurological: He is alert and oriented to person, place, and time. No cranial nerve deficit.  No dysarthria, no aphasia  Skin: Skin is warm and dry. No rash noted. No erythema.  Psychiatric: He has a normal mood and affect. His behavior is normal. Judgment and thought content normal.    LABORATORY PANEL:   CBC  Recent Labs Lab 01/28/17 1411  WBC 6.1  HGB 15.5  HCT 47.5  PLT 136*   ------------------------------------------------------------------------------------------------------------------  Chemistries   Recent Labs Lab 01/28/17 1411  NA 136  K 5.1  CL 106  CO2 22  GLUCOSE 99  BUN 22*  CREATININE 1.18  CALCIUM 9.4  AST 107*  ALT 70*  ALKPHOS 152*  BILITOT 1.2   ------------------------------------------------------------------------------------------------------------------  Cardiac Enzymes  Recent Labs Lab 01/28/17 1411  TROPONINI <0.03   ------------------------------------------------------------------------------------------------------------------  RADIOLOGY:  Ct Head Wo Contrast  Result Date:  01/28/2017 CLINICAL DATA:  83 year old male with altered mental status. EXAM: CT HEAD WITHOUT CONTRAST TECHNIQUE: Contiguous axial images were obtained from the base of the skull through the vertex without intravenous contrast. COMPARISON:  08/24/2016 head CT FINDINGS: Brain: No evidence of acute infarction, hemorrhage, hydrocephalus, extra-axial collection or mass lesion/mass effect. Atrophy and chronic small-vessel white matter ischemic changes again noted. Vascular: Intracranial atherosclerotic calcifications noted. Skull: No acute abnormality. Sinuses/Orbits: No acute finding. Other: None. IMPRESSION: No evidence of acute intracranial abnormality. Atrophy and chronic small-vessel white matter ischemic changes. Electronically Signed   By: Margarette Canada M.D.   On: 01/28/2017 19:12   Dg Abd Acute W/chest  Result Date: 01/28/2017 CLINICAL DATA:  Constipation for the past 5 days. History of lung cancer and smoking. EXAM: DG ABDOMEN ACUTE W/ 1V CHEST COMPARISON:  01/23/2017; 01/15/2017; chest CT - 01/15/2017; PET-CT - 01/25/2017 FINDINGS: Grossly unchanged cardiac silhouette and mediastinal contours with atherosclerotic plaque within a tortuous and potentially ectatic thoracic aorta. Known right perihilar mass is grossly unchanged. Architectural distortion and bronchiectasis within the peripheral aspect  the right mid lung the left costophrenic angle is also unchanged. Unchanged right-sided volume loss with mild elevation the right hemidiaphragm. No new focal airspace opacities. No pleural effusion or pneumothorax. Marked distention of the rectum with radiopaque enteric contrast and stool. Mild gaseous distention of the upstream colon. No evidence of enteric obstruction. No pneumoperitoneum, pneumatosis or portal venous gas. Deformity involving the seventh rib is unchanged. Mild scoliotic curvature of the thoracolumbar spine, potentially positional. 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 IMPRESSION: 1. Marked distention of the rectum with radiopaque enteric contrast and stool. 2. Findings suggestive of colonic ileus without evidence of enteric obstruction. 3. Stable examination of the chest, including known right perihilar mass and scattered areas of atelectasis/scar, without superimposed acute cardiopulmonary disease. 4.  Aortic Atherosclerosis (ICD10-170.0) Electronically Signed   By: Sandi Mariscal M.D.   On: 01/28/2017 13:59   Ct Renal Stone Study  Result Date: 01/28/2017 CLINICAL DATA:  Right lower quadrant abdominal pain, stage IV lung cancer EXAM: CT ABDOMEN AND PELVIS WITHOUT CONTRAST TECHNIQUE: Multidetector CT imaging of the abdomen and pelvis was performed following the standard protocol without IV contrast. COMPARISON:  PET-CT dated 01/25/2017 FINDINGS: Lower chest: Right lower lobe mass, corresponding to suspected primary bronchogenic neoplasm. Satellite nodularity in the right lower lobe (series 5/ image 5). Additional pulmonary metastases in the right middle lobe (series 5/ image 7) lingula (series 5/ image 10) and medial left lower lobe (series 5/ image 14). Hepatobiliary: Heterogeneous appearance of the liver with innumerable hepatic metastases. For example, there is a dominant 3.2 cm metastasis in the posterior right hepatic dome (series 3/ image 9). Gallbladder is unremarkable. No intrahepatic or extrahepatic ductal dilatation. Pancreas: Within normal limits. Spleen: Within normal limits. Adrenals/Urinary Tract: Adrenal glands are within normal limits. 7.7 cm right lower pole renal cyst (series 3/ image 41). Additional subcentimeter right renal cyst (series 3/ image 32). Left kidney is within normal limits. No hydronephrosis. Bladder is underdistended with suspected layering hemorrhage (series 7/image 78). Minimal nondependent gas. Stomach/Bowel: Stomach is within normal limits. No evidence of bowel obstruction. Normal appendix (series 3/image 59). Moderate  stool in the rectum (series 3/ image 74), suggesting mild rectal impaction. Vascular/Lymphatic: No evidence of abdominal aortic aneurysm. Atherosclerotic calcifications of the abdominal aorta and branch vessels. Suspected mildly prominent lymph nodes in the porta hepatis, although poorly evaluated on unenhanced CT. Reproductive: Prostate is grossly unremarkable. Other: No abdominopelvic ascites. Musculoskeletal: 10 mm lytic lesion in the posterior left inferior pubic ramus (series 3/ image 88), worrisome for osseous metastasis. IMPRESSION: No findings to account for the patient's right lower quadrant pain. Moderate stool in the rectum, suggesting mild rectal impaction. Right lower lobe mass, suggesting primary bronchogenic neoplasm, better evaluated on recent PET-CT. Bilateral pulmonary metastases, incompletely visualized. Innumerable hepatic metastases. Suspected nodal metastases in the porta hepatis are better evaluated on recent PET-CT. 10 mm lytic metastasis in the posterior left inferior pubic ramus. Additional ancillary findings as above. Electronically Signed   By: Julian Hy M.D.   On: 01/28/2017 19:15    EKG:   Orders placed or performed in visit on 01/28/17  . EKG 12-Lead    IMPRESSION AND PLAN:  Principal Problem:   Fecal impaction in rectum ([place]) - that is post-manual disimpaction and multiple enemas, still has significant stool burden. We will admit him tonight the plan for further enema. Active Problems:   Dehydration - this is likely the primary cause of the patient's profound constipation.  We will hydrate him with IV fluids tonight   Dementia - continue home meds   HTN (hypertension) - stable, continue home meds   Atrial fibrillation (HCC) - rate controlled, monitor  All the records are reviewed and case discussed with ED provider. Management plans discussed with the patient and/or family.  DVT PROPHYLAXIS: SubQ lovenox  GI PROPHYLAXIS: None  ADMISSION STATUS:  Observation  CODE STATUS: Full Code Status History    Date Active Date Inactive Code Status Order ID Comments User Context   08/24/2016  4:14 PM 08/25/2016  6:34 PM Full Code [id number]  Theodoro Grist, MD Inpatient      TOTAL TIME TAKING CARE OF THIS PATIENT: 40 minutes.    Edrees Valent [place] 01/28/2017, 8:37 PM  [facility]  Office  [phone]  CC: Primary care physician; Marden Noble, MD

## 2017-01-28 NOTE — ED Notes (Signed)
SMOG enema complete and patient has been cleaned and linens changed.

## 2017-01-29 DIAGNOSIS — K5641 Fecal impaction: Secondary | ICD-10-CM | POA: Diagnosis not present

## 2017-01-29 LAB — BASIC METABOLIC PANEL
ANION GAP: 8 (ref 5–15)
Anion gap: 7 (ref 5–15)
BUN: 26 mg/dL — AB (ref 6–20)
BUN: 28 mg/dL — ABNORMAL HIGH (ref 6–20)
CHLORIDE: 105 mmol/L (ref 101–111)
CHLORIDE: 108 mmol/L (ref 101–111)
CO2: 23 mmol/L (ref 22–32)
CO2: 24 mmol/L (ref 22–32)
CREATININE: 1.3 mg/dL — AB (ref 0.61–1.24)
Calcium: 9.2 mg/dL (ref 8.9–10.3)
Calcium: 8.7 mg/dL — ABNORMAL LOW (ref 8.9–10.3)
Creatinine, Ser: 1.32 mg/dL — ABNORMAL HIGH (ref 0.61–1.24)
GFR calc Af Amer: 54 mL/min — ABNORMAL LOW (ref >60)
GFR calc non Af Amer: 48 mL/min — ABNORMAL LOW (ref >60)
GFR, EST AFRICAN AMERICAN: 55 mL/min — AB (ref >60)
GFR, EST NON AFRICAN AMERICAN: 47 mL/min — AB (ref >60)
GLUCOSE: 110 mg/dL — AB (ref 65–99)
GLUCOSE: 143 mg/dL — AB (ref 65–99)
POTASSIUM: 5.5 mmol/L — AB (ref 3.5–5.1)
Potassium: 4.4 mmol/L (ref 3.5–5.1)
Sodium: 136 mmol/L (ref 135–145)
Sodium: 139 mmol/L (ref 135–145)

## 2017-01-29 LAB — CBC
HEMATOCRIT: 45.7 % (ref 40.0–52.0)
HEMOGLOBIN: 15 g/dL (ref 13.0–18.0)
MCH: 24.2 pg — ABNORMAL LOW (ref 26.0–34.0)
MCHC: 32.8 g/dL (ref 32.0–36.0)
MCV: 73.9 fL — AB (ref 80.0–100.0)
Platelets: 147 10*3/uL — ABNORMAL LOW (ref 150–440)
RBC: 6.18 MIL/uL — ABNORMAL HIGH (ref 4.40–5.90)
RDW: 14.8 % — AB (ref 11.5–14.5)
WBC: 7.6 10*3/uL (ref 3.8–10.6)

## 2017-01-29 LAB — T3: T3, Total: 92 ng/dL (ref 71–180)

## 2017-01-29 MED ORDER — SODIUM POLYSTYRENE SULFONATE 15 GM/60ML PO SUSP
30.0000 g | Freq: Once | ORAL | Status: AC
  Administered 2017-01-29: 09:00:00 30 g via ORAL
  Filled 2017-01-29: qty 120

## 2017-01-29 MED ORDER — DOCUSATE SODIUM 50 MG/5ML PO LIQD
50.0000 mg | Freq: Every day | ORAL | Status: DC
  Filled 2017-01-29 (×2): qty 10

## 2017-01-29 MED ORDER — METOPROLOL TARTRATE 25 MG PO TABS
12.5000 mg | ORAL_TABLET | Freq: Two times a day (BID) | ORAL | Status: DC
  Administered 2017-01-29: 15:00:00 12.5 mg via ORAL
  Filled 2017-01-29 (×2): qty 1

## 2017-01-29 MED ORDER — SODIUM CHLORIDE 0.9 % IV SOLN
INTRAVENOUS | Status: DC
  Administered 2017-01-29 – 2017-01-30 (×2): via INTRAVENOUS

## 2017-01-29 MED ORDER — DOCUSATE SODIUM 100 MG PO CAPS
100.0000 mg | ORAL_CAPSULE | Freq: Every day | ORAL | Status: DC
  Administered 2017-01-30 – 2017-01-31 (×2): 100 mg via ORAL
  Filled 2017-01-29 (×2): qty 1

## 2017-01-29 MED ORDER — DOCUSATE SODIUM 50 MG PO CAPS
50.0000 mg | ORAL_CAPSULE | Freq: Every day | ORAL | Status: DC
  Filled 2017-01-29: qty 1

## 2017-01-29 NOTE — Plan of Care (Signed)
Problem: Education: Goal: Knowledge of [facility] Education information/materials will improve Outcome: Progressing Metoprolol started for elevated hr. Cardiology consult  Ordered.

## 2017-01-29 NOTE — Care Management Note (Signed)
Case Management Note  Patient Details  Name: DEANTA MINCEY MRN: [id number] Date of Birth: [birth year]  Subjective/Objective:          Daughter is POA. Will attempt to contact her this afternoon per Observation status.           Action/Plan:   Expected Discharge Date:                  Expected Discharge Plan:     In-House Referral:     Discharge planning Services     Post Acute Care Choice:    Choice offered to:     DME Arranged:    DME Agency:     HH Arranged:    HH Agency:     Status of Service:     If discussed at [employer] of Stay Meetings, dates discussed:    Additional Comments:  Lorine Iannaccone A, RN 01/29/2017, 5:17 PM

## 2017-01-29 NOTE — Progress Notes (Addendum)
[facility] [place] NAME: Wayne Le    MR#:  [id number]  DATE OF BIRTH:  02-24-1927  SUBJECTIVE:  CHIEF COMPLAINT:   Chief Complaint  Patient presents with  . Constipation   Better abdominal pain after bowel movement. REVIEW OF SYSTEMS:  Review of Systems  Constitutional: Positive for malaise/fatigue and weight loss. Negative for chills and fever.  HENT: Negative for congestion.   Eyes: Negative for blurred vision and double vision.  Respiratory: Negative for cough, shortness of breath and stridor.   Cardiovascular: Negative for chest pain and leg swelling.  Gastrointestinal: Positive for abdominal pain and constipation. Negative for blood in stool, melena, nausea and vomiting.  Genitourinary: Negative for dysuria and hematuria.  Musculoskeletal: Negative for back pain.  Skin: Negative for itching and rash.  Neurological: Positive for weakness. Negative for dizziness, focal weakness, loss of consciousness and headaches.    DRUG ALLERGIES:   Allergies  Allergen Reactions  . Iodine Swelling  . Other Other (See Comments)  . Penicillins Hives and Rash   VITALS:  Blood pressure 136/63, pulse 89, temperature 98 F (36.7 C), temperature source Oral, resp. rate 20, height '5\' 9"'$  (1.753 m), weight 145 lb 1.6 oz (65.8 kg), SpO2 96 %. PHYSICAL EXAMINATION:  Physical Exam  Constitutional: He is oriented to person, place, and time. No distress.  Severe malnutrition.  HENT:  Head: Normocephalic.  Dry oral mucosa  Eyes: Conjunctivae and EOM are normal.  Neck: Normal range of motion. Neck supple. No JVD present. No tracheal deviation present.  Cardiovascular: Exam reveals no gallop.   Murmur heard. 2/6 systolic murmur, irregular rate and rhythm, tachycardia.  Pulmonary/Chest: Effort normal and breath sounds normal. No respiratory distress. He has no wheezes. He has no rales.  Abdominal: Soft. Bowel sounds are normal. He exhibits no  distension. There is no tenderness.  Musculoskeletal: He exhibits no edema or tenderness.  Neurological: He is alert and oriented to person, place, and time.  Skin: No rash noted. No erythema.  Psychiatric:  Demented.   LABORATORY PANEL:  Male CBC  Recent Labs Lab 01/29/17 0457  WBC 7.6  HGB 15.0  HCT 45.7  PLT 147*   ------------------------------------------------------------------------------------------------------------------ Chemistries   Recent Labs Lab 01/28/17 1411 01/29/17 0457  NA 136 136  K 5.1 5.5*  CL 106 105  CO2 22 23  GLUCOSE 99 110*  BUN 22* 26*  CREATININE 1.18 1.32*  CALCIUM 9.4 9.2  AST 107*  --   ALT 70*  --   ALKPHOS 152*  --   BILITOT 1.2  --    RADIOLOGY:  Ct Head Wo Contrast  Result Date: 01/28/2017 CLINICAL DATA:  83 year old male with altered mental status. EXAM: CT HEAD WITHOUT CONTRAST TECHNIQUE: Contiguous axial images were obtained from the base of the skull through the vertex without intravenous contrast. COMPARISON:  08/24/2016 head CT FINDINGS: Brain: No evidence of acute infarction, hemorrhage, hydrocephalus, extra-axial collection or mass lesion/mass effect. Atrophy and chronic small-vessel white matter ischemic changes again noted. Vascular: Intracranial atherosclerotic calcifications noted. Skull: No acute abnormality. Sinuses/Orbits: No acute finding. Other: None. IMPRESSION: No evidence of acute intracranial abnormality. Atrophy and chronic small-vessel white matter ischemic changes. Electronically Signed   By: Margarette Canada M.D.   On: 01/28/2017 19:12   Dg Abd Acute W/chest  Result Date: 01/28/2017 CLINICAL DATA:  Constipation for the past 5 days. History of lung cancer and smoking. EXAM: DG ABDOMEN ACUTE W/ 1V CHEST COMPARISON:  01/23/2017; 01/15/2017; chest CT - 01/15/2017; PET-CT - 01/25/2017 FINDINGS: Grossly unchanged cardiac silhouette and mediastinal contours with atherosclerotic plaque within a tortuous and potentially  ectatic thoracic aorta. Known right perihilar mass is grossly unchanged. Architectural distortion and bronchiectasis within the peripheral aspect the right mid lung the left costophrenic angle is also unchanged. Unchanged right-sided volume loss with mild elevation the right hemidiaphragm. No new focal airspace opacities. No pleural effusion or pneumothorax. Marked distention of the rectum with radiopaque enteric contrast and stool. Mild gaseous distention of the upstream colon. No evidence of enteric obstruction. No pneumoperitoneum, pneumatosis or portal venous gas. Deformity involving the seventh rib is unchanged. Mild scoliotic curvature of the thoracolumbar spine, potentially positional. 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 IMPRESSION: 1. Marked distention of the rectum with radiopaque enteric contrast and stool. 2. Findings suggestive of colonic ileus without evidence of enteric obstruction. 3. Stable examination of the chest, including known right perihilar mass and scattered areas of atelectasis/scar, without superimposed acute cardiopulmonary disease. 4.  Aortic Atherosclerosis (ICD10-170.0) Electronically Signed   By: Sandi Mariscal M.D.   On: 01/28/2017 13:59   Ct Renal Stone Study  Result Date: 01/28/2017 CLINICAL DATA:  Right lower quadrant abdominal pain, stage IV lung cancer EXAM: CT ABDOMEN AND PELVIS WITHOUT CONTRAST TECHNIQUE: Multidetector CT imaging of the abdomen and pelvis was performed following the standard protocol without IV contrast. COMPARISON:  PET-CT dated 01/25/2017 FINDINGS: Lower chest: Right lower lobe mass, corresponding to suspected primary bronchogenic neoplasm. Satellite nodularity in the right lower lobe (series 5/ image 5). Additional pulmonary metastases in the right middle lobe (series 5/ image 7) lingula (series 5/ image 10) and medial left lower lobe (series 5/ image 14). Hepatobiliary: Heterogeneous appearance of the  liver with innumerable hepatic metastases. For example, there is a dominant 3.2 cm metastasis in the posterior right hepatic dome (series 3/ image 9). Gallbladder is unremarkable. No intrahepatic or extrahepatic ductal dilatation. Pancreas: Within normal limits. Spleen: Within normal limits. Adrenals/Urinary Tract: Adrenal glands are within normal limits. 7.7 cm right lower pole renal cyst (series 3/ image 41). Additional subcentimeter right renal cyst (series 3/ image 32). Left kidney is within normal limits. No hydronephrosis. Bladder is underdistended with suspected layering hemorrhage (series 7/image 78). Minimal nondependent gas. Stomach/Bowel: Stomach is within normal limits. No evidence of bowel obstruction. Normal appendix (series 3/image 59). Moderate stool in the rectum (series 3/ image 74), suggesting mild rectal impaction. Vascular/Lymphatic: No evidence of abdominal aortic aneurysm. Atherosclerotic calcifications of the abdominal aorta and branch vessels. Suspected mildly prominent lymph nodes in the porta hepatis, although poorly evaluated on unenhanced CT. Reproductive: Prostate is grossly unremarkable. Other: No abdominopelvic ascites. Musculoskeletal: 10 mm lytic lesion in the posterior left inferior pubic ramus (series 3/ image 88), worrisome for osseous metastasis. IMPRESSION: No findings to account for the patient's right lower quadrant pain. Moderate stool in the rectum, suggesting mild rectal impaction. Right lower lobe mass, suggesting primary bronchogenic neoplasm, better evaluated on recent PET-CT. Bilateral pulmonary metastases, incompletely visualized. Innumerable hepatic metastases. Suspected nodal metastases in the porta hepatis are better evaluated on recent PET-CT. 10 mm lytic metastasis in the posterior left inferior pubic ramus. Additional ancillary findings as above. Electronically Signed   By: Julian Hy M.D.   On: 01/28/2017 19:15   ASSESSMENT AND PLAN:   Fecal  impaction in rectum,  post-manual disimpaction and multiple enemas, he had large BM this am.  Hyperkalemia. Given kayexalate and Follow-up BMP.  ARF due to Dehydration hydrate him with IV fluids and Follow-up BMP.    Dementia - continue home meds   HTN (hypertension) - stable, continue home meds   Atrial fibrillation with RVR, continue ASA, start low dose lopressor, cardiology consult. Lung cancer with metastasis. Liver biopsy to Thursday as outpatient.  Severe malnutrition dietitian consult. Generalized weakness. PT evaluation. All the records are reviewed and case discussed with Care Management/Social Worker. Management plans discussed with the patient, his 2 daughters and they are in agreement.  CODE STATUS: Full Code  TOTAL TIME TAKING CARE OF THIS PATIENT: 35 minutes.   More than 50% of the time was spent in counseling/coordination of care: YES  POSSIBLE D/C IN 1-2 DAYS, DEPENDING ON CLINICAL CONDITION.   Demetrios Loll M.D on 01/29/2017 at 12:07 PM  Between 7am to 6pm - Pager - [phone]  After 6pm go to [email or web address] - Proofreader  [facility]  Office  [phone]  CC: Primary care physician; Marden Noble, MD  Note: This dictation was prepared with Dragon dictation along with smaller phrase technology. Any transcriptional errors that result from this process are unintentional.

## 2017-01-29 NOTE — Progress Notes (Signed)
DrChen notified of  pts heartrate and rhythm of  Flutter and 99-112.   No new orders. Pt started on  metopolol and cardiology consult  Ordered. Earlier. ccmd notifed me earlier of pts rate and rhythm and md called.  ccmd  Informed of mds response.

## 2017-01-29 NOTE — Care Management Obs Status (Signed)
[place] NOTIFICATION   Patient Details  Name: Wayne Le MRN: [id number] Date of Birth: 08-26-1927   Medicare Observation Status Notification Given:   (having a cardiac event, mr Giannini not responsive to me. No MOON given)    Orena Cavazos A, RN 01/29/2017, 5:05 PM

## 2017-01-29 NOTE — Progress Notes (Signed)
Advanced Care Plan.  Purpose of Encounter: code status Parties in Attendance: The patient, [name]) and me. Patient's Decisional Capacity: No. Medical Story: The patient was placed for observation due to constipation. He also has acute renal failure due to dehydration and hyperkalemia. He has a history of dementia and recent lung cancer with metastasis. I discussed with the patient's daughter, who is POA about her patient's condition and poor prognosis. The daughter said the patient mentioned that he doesn't want resuscitation or intubation.  After meeting with his daughters, I changed from full code to DO NOT RESUSCITATE.  Goals of Care Determinations: DNR. Plan:  Code Status: DNR. Time spent discussing advance care planning: 18 minutes.

## 2017-01-29 NOTE — Plan of Care (Signed)
Problem: Safety: Goal: Ability to remain free from injury will improve Outcome: Progressing Assisted to be oob

## 2017-01-30 DIAGNOSIS — K5641 Fecal impaction: Secondary | ICD-10-CM | POA: Diagnosis not present

## 2017-01-30 LAB — BASIC METABOLIC PANEL
ANION GAP: 6 (ref 5–15)
BUN: 22 mg/dL — ABNORMAL HIGH (ref 6–20)
CHLORIDE: 107 mmol/L (ref 101–111)
CO2: 23 mmol/L (ref 22–32)
Calcium: 8.5 mg/dL — ABNORMAL LOW (ref 8.9–10.3)
Creatinine, Ser: 1.09 mg/dL (ref 0.61–1.24)
GFR calc non Af Amer: 59 mL/min — ABNORMAL LOW (ref >60)
Glucose, Bld: 94 mg/dL (ref 65–99)
Potassium: 4.2 mmol/L (ref 3.5–5.1)
Sodium: 136 mmol/L (ref 135–145)

## 2017-01-30 LAB — TROPONIN I

## 2017-01-30 MED ORDER — METOPROLOL TARTRATE 25 MG PO TABS
25.0000 mg | ORAL_TABLET | Freq: Two times a day (BID) | ORAL | 0 refills | Status: DC
Start: 2017-01-30 — End: 2017-02-08

## 2017-01-30 MED ORDER — ASPIRIN EC 81 MG PO TBEC
162.0000 mg | DELAYED_RELEASE_TABLET | Freq: Once | ORAL | Status: AC
  Administered 2017-01-30: 162 mg via ORAL
  Filled 2017-01-30: qty 2

## 2017-01-30 MED ORDER — METOPROLOL TARTRATE 25 MG PO TABS
25.0000 mg | ORAL_TABLET | Freq: Two times a day (BID) | ORAL | Status: DC
  Administered 2017-01-30 – 2017-01-31 (×3): 25 mg via ORAL
  Filled 2017-01-30 (×3): qty 1

## 2017-01-30 NOTE — Evaluation (Signed)
Physical Therapy Evaluation Patient Details Name: Wayne Le MRN: [id number] DOB: 14-Sep-1927 Today's Date: 01/30/2017   History of Present Illness  Pt is a 83 yo male admitted to [facility] w/ complaints of constipation, diagnosed w/ rectal impaction and ARF w/ dehydration. PMH includes; metastic lung cancer, dementia, OA, cataract, HTN, Prostate disease, A-dfib, and hypothyroidism    Clinical Impression  Pt awake and alert and willing to participate in PT eval. Pt appeared disoriented to time and place and has a history of dementia at baseline, he is independent in all ADLs and is active with housework; his daughters live close by and check in on him frequently during the day. Pt overall strength appears WFLs and he is able to transfer OOB to standing w/ RW w/o difficulty. Pt displays some balance concerns and demonstrates improved stability w/ use of Rw, recommend he continue to use RW during functional tasks. Pt displayed decrease activity tolerance during ambulation secondary to cardiopulmonary impairments, HR started at 70 bpm at rest and increased to 145 while ambulating w/ mild chest pain, ambulation was terminated and patient was safely transferred to chair and wheeled back to room. Nursing was informed of chest pain and HR and was present end of session. Overall pateint is limited in functional mobility due to decreased balance and activity tolerance, he will benefit from skilled PT to correct deficits, recommend he receive HHPT following acute hospital stay.     Follow Up Recommendations Home health PT;Supervision - Intermittent    Equipment Recommendations    None by Pt, patient already has 4 wheel RW and SPC at home   Recommendations for Other Services       Precautions / Restrictions Precautions Precautions: Fall Restrictions Weight Bearing Restrictions: No      Mobility  Bed Mobility Overal bed mobility: Needs Assistance Bed Mobility: Supine to Sit     Supine to sit:  Supervision     General bed mobility comments: pt able to move to sitting at EOB w/ increased time, no complaints of chest pain or dizziness   Transfers Overall transfer level: Modified independent Equipment used: Rolling walker (2 wheeled)             General transfer comment: pt use B UE's to transfer to standing, slightly flexed forward in standing   Ambulation/Gait Ambulation/Gait assistance: Min guard Ambulation Distance (Feet): 70 Feet Assistive device: Rolling walker (2 wheeled) Gait Pattern/deviations: Step-through pattern;Trunk flexed;Decreased stride length     General Gait Details: pt ambulated w/ symmetrical gait pattern, remained slightly forward flexed throughout, ambulated past nursing station and HR jumped from 100-140 w/ complaints of mild chest pain and pt safely wheeled back into room  Stairs            Wheelchair Mobility    Modified Rankin (Stroke Patients Only)       Balance Overall balance assessment: Needs assistance Sitting-balance support: No upper extremity supported;Feet supported Sitting balance-Leahy Scale: Good Sitting balance - Comments: able to maintain sitting posture w/o back support   Standing balance support: Bilateral upper extremity supported;During functional activity Standing balance-Leahy Scale: Good Standing balance comment: improved stability w/ use of RW                             Pertinent Vitals/Pain Pain Assessment: No/denies pain    Home Living Family/patient expects to be discharged to:: Private residence Living Arrangements: Alone Available Help at Discharge: Family (daughter checks in  on him multiple times a day) Type of Home: House Home Access: Stairs to enter Entrance Stairs-Rails: Can reach both Entrance Stairs-Number of Steps: 4 Home Layout: One level Home Equipment: Cane - single point;Walker - 4 wheels Additional Comments: pt states he has SPC and RW but seldomly uses them for  mobility    Prior Function Level of Independence: Independent         Comments: Pt. performs landscaping and roofing work intermittently, independent for all ADL's, driving     Hand Dominance        Extremity/Trunk Assessment   Upper Extremity Assessment Upper Extremity Assessment: Overall WFL for tasks assessed    Lower Extremity Assessment Lower Extremity Assessment: Overall WFL for tasks assessed       Communication   Communication: No difficulties  Cognition Arousal/Alertness: Awake/alert Behavior During Therapy: WFL for tasks assessed/performed Overall Cognitive Status: History of cognitive impairments - at baseline                 General Comments: disoriented to time and place, able to respond appropriately to questions and commands     General Comments      Exercises     Assessment/Plan    PT Assessment Patient needs continued PT services  PT Problem List Decreased activity tolerance;Decreased balance;Decreased knowledge of use of DME;Decreased mobility;Cardiopulmonary status limiting activity       PT Treatment Interventions DME instruction;Gait training;Stair training;Functional mobility training;Balance training;Therapeutic exercise;Therapeutic activities;Patient/family education    PT Goals (Current goals can be found in the Care Plan section)  Acute Rehab PT Goals Patient Stated Goal: Return home  PT Goal Formulation: With patient Time For Goal Achievement: 2017-03-03 Potential to Achieve Goals: Good    Frequency Min 2X/week   Barriers to discharge        Co-evaluation               End of Session Equipment Utilized During Treatment: Gait belt Activity Tolerance: Patient limited by fatigue;Treatment limited secondary to medical complications (Comment) (HR 145 and mild chest pain) Patient left: in chair;with call bell/phone within reach;with chair alarm set;with nursing/sitter in room Nurse Communication: Mobility status PT  Visit Diagnosis: Unsteadiness on feet (R26.81)         Time: 2426-8341 PT Time Calculation (min) (ACUTE ONLY): 18 min   Charges:         PT G Codes:         [employer] Student PT 01/30/2017, 10:09 AM

## 2017-01-30 NOTE — Progress Notes (Signed)
   01/30/17 1358  Vitals  BP 129/61  MAP (mmHg) 76  BP Location Left Arm  BP Method Automatic  Patient Position (if appropriate) Lying  Pulse Rate 73  Pulse Rate Source Monitor  Oxygen Therapy  SpO2 95 %  O2 Device Room Air   Pt c/o 10/10 chest pain. When pt asked to show me where it hurts, he rubs around his stomach area not his chest area.  Dr. Bridgett Larsson paged twice and have yet to receive call back.  Daughter is at bedside stated that the hospital is "about to have a lawsuit on our hands" and yelling out in hallway.  Clarise Cruz, RN

## 2017-01-30 NOTE — Discharge Summary (Signed)
[facility] [place] NAME: Wayne Le    MR#:  [id number]  DATE OF BIRTH:  [birth year]  DATE OF ADMISSION:  01/28/2017   ADMITTING PHYSICIAN: Lance Coon, MD  DATE OF DISCHARGE: 01/30/2017  PRIMARY CARE PHYSICIAN: Marden Noble, MD   ADMISSION DIAGNOSIS:  Paroxysmal atrial fibrillation ([place]) [I48.0] Right lower quadrant abdominal pain [R10.31] Constipation, unspecified constipation type [K59.00] Hypothyroidism, unspecified type [E03.9] DISCHARGE DIAGNOSIS:  Principal Problem:   Fecal impaction in rectum (H[facility]) Active Problems:   Dementia   HTN (hypertension)   Atrial fibrillation ([place])   Dehydration  SECONDARY DIAGNOSIS:   Past Medical History:  Diagnosis Date  . Arthritis   . Cancer ([place])   . Cataract   . Hypertension   . Memory changes   . Prostate disorder    HOSPITAL COURSE:  Fecal impaction in rectum,  post-manual disimpaction and multiple enemas, he had several BM.  Hyperkalemia. Given kayexalate and Follow-up BMP.  ARF due to Dehydration improved with IV fluids.  Dementia - continue home meds HTN (hypertension) - stable, continue home meds Atrial fibrillation with RVR, continue ASA, started lopressor, continue ASA and lopressor per Dr. Saralyn Pilar, cardiology consult. Lung cancer with metastasis. Liver biopsy to Thursday as outpatient.   Severe malnutrition dietitian consult. Generalized weakness. PT evaluation: HHPT, but his daughter has concerns about discharge to home, she wants SNF placement. I discussed with Dr. Saralyn Pilar. DISCHARGE CONDITIONS:  Stable, discharge to home with HHPT. CONSULTS OBTAINED:  Treatment Team:  Corey Skains, MD Isaias Cowman, MD DRUG ALLERGIES:   Allergies  Allergen Reactions  . Iodine Swelling  . Other Other (See Comments)  . Penicillins Hives and Rash   DISCHARGE MEDICATIONS:   Allergies as of 01/30/2017      Reactions   Iodine Swelling   Other Other (See Comments)   Penicillins Hives, Rash      Medication List    STOP taking these medications   meloxicam 15 MG tablet Commonly known as:  MOBIC     TAKE these medications   aspirin EC 81 MG tablet Take 81 mg by mouth daily.   docusate sodium 50 MG capsule Commonly known as:  COLACE Take 50 mg by mouth daily.   dutasteride 0.5 MG capsule Commonly known as:  AVODART Take 1 capsule by mouth daily.   metoprolol tartrate 25 MG tablet Commonly known as:  LOPRESSOR Take 1 tablet (25 mg total) by mouth 2 (two) times daily.   mirtazapine 15 MG tablet Commonly known as:  REMERON Take 15 mg by mouth at bedtime.   NAMZARIC 28-10 MG Cp24 Generic drug:  Memantine HCl-Donepezil HCl Take 1 capsule by mouth daily.   Omega 3 1000 MG Caps Take 1,000 mg by mouth daily.   polyethylene glycol packet Commonly known as:  MIRALAX Take 17 g by mouth daily.        DISCHARGE INSTRUCTIONS:  See AVS.  If you experience worsening of your admission symptoms, develop shortness of breath, life threatening emergency, suicidal or homicidal thoughts you must seek medical attention immediately by calling 911 or calling your MD immediately  if symptoms less severe.  You Must read complete instructions/literature along with all the possible adverse reactions/side effects for all the Medicines you take and that have been prescribed to you. Take any new Medicines after you have completely understood and accpet all the possible adverse reactions/side effects.   Please note  You were cared for  by a hospitalist during your hospital stay. If you have any questions about your discharge medications or the care you received while you were in the hospital after you are discharged, you can call the unit and asked to speak with the hospitalist on call if the hospitalist that took care of you is not available. Once you are discharged, your primary care physician will handle any further medical  issues. Please note that NO REFILLS for any discharge medications will be authorized once you are discharged, as it is imperative that you return to your primary care physician (or establish a relationship with a primary care physician if you do not have one) for your aftercare needs so that they can reassess your need for medications and monitor your lab values.    On the day of Discharge:  VITAL SIGNS:  Blood pressure (!) 107/56, pulse 63, temperature 98.3 F (36.8 C), temperature source Oral, resp. rate 20, height '5\' 9"'$  (1.753 m), weight 145 lb 1.6 oz (65.8 kg), SpO2 93 %. PHYSICAL EXAMINATION:  GENERAL:  83 y.o.-year-old patient lying in the bed with no acute distress. Thin. EYES: Pupils equal, round, reactive to light and accommodation. No scleral icterus. Extraocular muscles intact.  HEENT: Head atraumatic, normocephalic. Oropharynx and nasopharynx clear.  NECK:  Supple, no jugular venous distention. No thyroid enlargement, no tenderness.  LUNGS: Normal breath sounds bilaterally, no wheezing, rales,rhonchi or crepitation. No use of accessory muscles of respiration.  CARDIOVASCULAR: S1, S2 normal. No murmurs, rubs, or gallops.  ABDOMEN: Soft, non-tender, non-distended. Bowel sounds present. No organomegaly or mass.  EXTREMITIES: No pedal edema, cyanosis, or clubbing.  NEUROLOGIC: Cranial nerves II through XII are intact. Muscle strength 4/5 in all extremities. Sensation intact. Gait not checked.  PSYCHIATRIC: The patient is alert and oriented x 2-3, demented. SKIN: No obvious rash, lesion, or ulcer.  DATA REVIEW:   CBC  Recent Labs Lab 01/29/17 0457  WBC 7.6  HGB 15.0  HCT 45.7  PLT 147*    Chemistries   Recent Labs Lab 01/28/17 1411  01/30/17 0449  NA 136  < > 136  K 5.1  < > 4.2  CL 106  < > 107  CO2 22  < > 23  GLUCOSE 99  < > 94  BUN 22*  < > 22*  CREATININE 1.18  < > 1.09  CALCIUM 9.4  < > 8.5*  AST 107*  --   --   ALT 70*  --   --   ALKPHOS 152*  --   --     BILITOT 1.2  --   --   < > = values in this interval not displayed.   Microbiology Results  Results for orders placed or performed during the hospital encounter of 01/23/17  Urine culture     Status: None   Collection Time: 01/23/17  3:05 PM  Result Value Ref Range Status   Specimen Description URINE, RANDOM  Final   Special Requests NONE  Final   Culture   Final    NO GROWTH Performed at [facility], [place] [address], [place], [place] [address]    Report Status 01/24/2017 FINAL  Final    RADIOLOGY:  No results found.   Management plans discussed with the patient, his daughters and they are in agreement.  CODE STATUS: DNR   TOTAL TIME TAKING CARE OF THIS PATIENT: 37 minutes.    Demetrios Loll M.D on 01/30/2017 at 1:00 PM  Between 7am to 6pm -  Pager - [phone]  After 6pm go to [email or web address] - Proofreader  [facility]  Office  [phone]  CC: Primary care physician; Marden Noble, MD   Note: This dictation was prepared with Dragon dictation along with smaller phrase technology. Any transcriptional errors that result from this process are unintentional.

## 2017-01-30 NOTE — Consult Note (Signed)
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 discrete mass, primarily hypoechoic, arises from the posterior right lobe measuring 6.4 hepatopetal flow documented in the portal vein. Multiple other lesions have a target appearance with a peripheral rim of lower echogenicity and central increased echogenicity. Incidental note is made of a 7.8 cm right renal cyst arising from the lower pole, which was present on the prior CT. IMPRESSION: 1. Numerous liver masses are seen extending throughout the liver with borderline hepatomegaly. Findings are consistent with widespread hepatic metastatic disease. Recommend follow-up CT scan of the chest, abdomen and pelvis to assess for a primary malignancy. This would be best performed with oral and intravenous contrast if this patient can tolerate contrast. 2. No acute findings. No gallstones or evidence of acute cholecystitis. No bile duct dilation. Electronically Signed   By: Lajean Manes M.D.   On: 01/15/2017 18:07    EKG: Atrial fibrillation, rate 107 bpm  ASSESSMENT AND PLAN:   1. Chronic atrial fibrillation, of unknown duration, with minimal elevation of heart rate, not on chronic anticoagulation 2. Intermittent chest pain with atypical features, no chest pain  today, with known CAD, negative troponin, without ischemic ECG changes  Recommendations  1. Agree with current therapy 2. Agree with aspirin 81 mg daily. Further consideration of chronic anticoagulation can be made as outpatient per Dr. Nehemiah Massed. 3. Agree with metoprolol titrate 25 mg twice a day 4. No further cardiac diagnostics at this time.  Signed: Isaias Cowman MD,PhD, [facility] 01/30/2017, 9:05 AM

## 2017-01-30 NOTE — Care Management Obs Status (Signed)
[place] NOTIFICATION   Patient Details  Name: Wayne Le MRN: [id number] Date of Birth: 19-Feb-1927   Medicare Observation Status Notification Given:  Yes    Shelbie Ammons, RN 01/30/2017, 9:23 AM

## 2017-01-30 NOTE — Progress Notes (Signed)
Dr Saralyn Pilar notified of ekg results for pt as requested by dr Bridgett Larsson.  Dr  Saralyn Pilar was  Not concerned about   These  Results. States "if  His hr was 40  I would be concerned"

## 2017-01-30 NOTE — Progress Notes (Signed)
Pt  Started c/o  Chest pain and abd pain. Rubs  Around  Abdominal area and   Upper chest  Area. Dr Bridgett Larsson notified/ ekg ordered and troponin level. Asa 162 mg  Given as ordered.  dtr refused to take pt home and left  She says" before  The weather gets any worse."

## 2017-01-30 NOTE — NC FL2 (Signed)
[place] LEVEL OF CARE SCREENING TOOL     IDENTIFICATION  Patient Name: Wayne Le Birthdate: 09/16/1927 Sex: male Admission Date (Current Location): 01/28/2017  [facility] and [place] Number:  Selena Lesser  ([id number]) Facility and Address:  [facility], [address] [place], [place] [address]      Provider Number: [id number]  Attending Physician Name and Address:  Demetrios Loll, MD  Relative Name and Phone Number:       Current Level of Care: Hospital Recommended Level of Care: [place] Prior Approval Number:    Date Approved/Denied:   PASRR Number:  [id number] )  Discharge Plan: SNF    Current Diagnoses: Patient Active Problem List   Diagnosis Date Noted  . Constipation 01/28/2017  . Fecal impaction in rectum ([place]) 01/28/2017  . Dementia 01/28/2017  . HTN (hypertension) 01/28/2017  . Atrial fibrillation ([place]) 01/28/2017  . Dehydration 01/28/2017  . Bone metastasis ([place]) 01/27/2017  . Right lower lobe lung mass 01/18/2017  . Mediastinal adenopathy 01/18/2017  . Liver lesion 01/18/2017  . Balance problems 01/18/2017  . Sepsis ([place]) 08/24/2016  . Acute gastroenteritis 08/24/2016  . Community acquired pneumonia 08/24/2016  . Acute renal insufficiency 08/24/2016  . Hyperbilirubinemia 08/24/2016  . Elevated troponin 08/24/2016    Orientation RESPIRATION BLADDER Height & Weight     Self, Place  Normal Continent Weight: 145 lb 1.6 oz (65.8 kg) Height:  '5\' 9"'$  (175.3 cm)  BEHAVIORAL SYMPTOMS/MOOD NEUROLOGICAL BOWEL NUTRITION STATUS   (none)  (none) Incontinent Diet (Diet: Heart Healthy )  AMBULATORY STATUS COMMUNICATION OF NEEDS Skin   Extensive Assist Verbally Normal                       Personal Care Assistance Level of Assistance  Bathing, Feeding, Dressing Bathing Assistance: Limited assistance Feeding assistance: Independent Dressing Assistance: Limited assistance     Functional  Limitations Info  Sight, Hearing, Speech Sight Info: Adequate Hearing Info: Impaired Speech Info: Adequate    SPECIAL CARE FACTORS FREQUENCY  PT (By licensed PT), OT (By licensed OT)     PT Frequency:  (5) OT Frequency:  (5)            Contractures      Additional Factors Info  Code Status, Allergies Code Status Info:  (DNR ) Allergies Info:  (Iodine, Other, Penicillins)           Current Medications (01/30/2017):  This is the current hospital active medication list Current Facility-Administered Medications  Medication Dose Route Frequency Provider Last Rate Last Dose  . acetaminophen (TYLENOL) tablet 650 mg  650 mg Oral Q6H PRN Lance Coon, MD       Or  . acetaminophen (TYLENOL) suppository 650 mg  650 mg Rectal Q6H PRN Lance Coon, MD      . aspirin EC tablet 81 mg  81 mg Oral Daily Lance Coon, MD   81 mg at 01/30/17 0819  . bisacodyl (DULCOLAX) suppository 10 mg  10 mg Rectal Daily PRN Lance Coon, MD   10 mg at 01/29/17 0908  . docusate sodium (COLACE) capsule 100 mg  100 mg Oral Daily Demetrios Loll, MD   100 mg at 01/30/17 0819  . memantine (NAMENDA XR) 24 hr capsule 28 mg  28 mg Oral Daily Lance Coon, MD   28 mg at 01/30/17 0820   And  . donepezil (ARICEPT) tablet 10 mg  10 mg Oral Daily Lance Coon, MD  10 mg at 01/30/17 0820  . dutasteride (AVODART) capsule 0.5 mg  0.5 mg Oral Daily Lance Coon, MD   0.5 mg at 01/30/17 0819  . enoxaparin (LOVENOX) injection 40 mg  40 mg Subcutaneous Q24H Lance Coon, MD   40 mg at 01/29/17 2241  . ketorolac (TORADOL) 15 MG/ML injection 15 mg  15 mg Intravenous Q6H PRN Lance Coon, MD   15 mg at 01/29/17 0000  . metoprolol tartrate (LOPRESSOR) tablet 25 mg  25 mg Oral BID Demetrios Loll, MD   25 mg at 01/30/17 0921  . mirtazapine (REMERON) tablet 15 mg  15 mg Oral QHS Lance Coon, MD   15 mg at 01/29/17 2241  . ondansetron (ZOFRAN) tablet 4 mg  4 mg Oral Q6H PRN Lance Coon, MD       Or  . ondansetron [facility]) injection 4 mg   4 mg Intravenous Q6H PRN Lance Coon, MD      . polyethylene glycol [facility] / GLYCOLAX) packet 17 g  17 g Oral Daily Lance Coon, MD   17 g at 01/30/17 0819  . sodium chloride flush (NS) 0.9 % injection 3 mL  3 mL Intravenous Q12H Lance Coon, MD   3 mL at 01/30/17 0820  . sodium phosphate (FLEET) 7-19 GM/118ML enema 1 enema  1 enema Rectal Once PRN Lance Coon, MD         Discharge Medications: Please see discharge summary for a list of discharge medications.  Relevant Imaging Results:  Relevant Lab Results:   Additional Information  (SSN: [id number])  Sample, Veronia Beets, LCSW

## 2017-01-30 NOTE — Care Management (Signed)
Admitted to [facility] with the diagnosis of fecal impaction under observation status. Live alone. Daughter is [name] [phone]). Dr. Brynda Greathouse is listed as primary care physician. Dr. Nehemiah Massed is cardiologist and was in the office 08/25/16. [facility] in the past 08/25/16. No skilled nursing. No home oxygen. Rolling walker and bedside commode in the home.  Physical therapist working with Mr. Cullens now. Cardiology has been in this morning. Shelbie Ammons RN MSN CCM Care Management

## 2017-01-31 DIAGNOSIS — K5641 Fecal impaction: Secondary | ICD-10-CM | POA: Diagnosis not present

## 2017-01-31 MED ORDER — OXYCODONE-ACETAMINOPHEN 5-325 MG PO TABS: 1.0000 | ORAL_TABLET | Freq: Four times a day (QID) | ORAL | 0 refills | Status: DC | PRN

## 2017-01-31 MED ORDER — OXYCODONE-ACETAMINOPHEN 5-325 MG PO TABS
1.0000 | ORAL_TABLET | ORAL | Status: DC | PRN
  Administered 2017-01-31: 09:00:00 1 via ORAL
  Filled 2017-01-31: qty 1

## 2017-01-31 NOTE — Discharge Summary (Signed)
[place] [facility] [place] NAME: Wayne Le    MR#:  [id number]  DATE OF BIRTH:  30-Sep-1927  DATE OF ADMISSION:  01/28/2017   ADMITTING PHYSICIAN: Lance Coon, MD  DATE OF DISCHARGE: 01/31/2017  PRIMARY CARE PHYSICIAN: Marden Noble, MD   ADMISSION DIAGNOSIS:  Paroxysmal atrial fibrillation ([place]) [I48.0] Right lower quadrant abdominal pain [R10.31] Constipation, unspecified constipation type [K59.00] Hypothyroidism, unspecified type [E03.9] DISCHARGE DIAGNOSIS:  Principal Problem:   Fecal impaction in rectum [facility]) Active Problems:   Dementia   HTN (hypertension)   Atrial fibrillation (HCC)   Dehydration Lung cancer with metastasis.  Severe malnutrition  SECONDARY DIAGNOSIS:   Past Medical History:  Diagnosis Date  . Arthritis   . Cancer ([place])   . Cataract   . Hypertension   . Memory changes   . Prostate disorder    HOSPITAL COURSE:  Fecal impaction in rectum,  post-manual disimpaction and multiple enemas, he had several BM.  Hyperkalemia. Given kayexalate and improved.  ARF due to Dehydration improved with IV fluids.  Dementia - continue home meds HTN (hypertension) - stable, continue home meds Atrial fibrillation with RVR, continue ASA, started lopressor, continue ASA and lopressor per Dr. Saralyn Pilar, cardiology consult.  Lung cancer with metastasis.  Pain control. Liver biopsy to Thursday as outpatient.   Severe malnutrition dietitian consult. Generalized weakness. PT evaluation: HHPT, but his daughter has concerns about discharge to home, she wants SNF placement.  E medications social worker, the patient could not be placed for skilled nursing facility due to reoccurrence. His daughters agree to be discharged with home health,  PT, skilled nursing and home palliate his care.  DISCHARGE CONDITIONS:  Stable, discharge to home with HHPT. CONSULTS OBTAINED:  Treatment Team:  Corey Skains,  MD Isaias Cowman, MD DRUG ALLERGIES:   Allergies  Allergen Reactions  . Iodine Swelling  . Other Other (See Comments)  . Penicillins Hives and Rash   DISCHARGE MEDICATIONS:   Allergies as of 01/31/2017      Reactions   Iodine Swelling   Other Other (See Comments)   Penicillins Hives, Rash      Medication List    STOP taking these medications   meloxicam 15 MG tablet Commonly known as:  MOBIC   Omega 3 1000 MG Caps     TAKE these medications   aspirin EC 81 MG tablet Take 81 mg by mouth daily.   docusate sodium 50 MG capsule Commonly known as:  COLACE Take 50 mg by mouth daily.   dutasteride 0.5 MG capsule Commonly known as:  AVODART Take 1 capsule by mouth daily.   metoprolol tartrate 25 MG tablet Commonly known as:  LOPRESSOR Take 1 tablet (25 mg total) by mouth 2 (two) times daily.   mirtazapine 15 MG tablet Commonly known as:  REMERON Take 15 mg by mouth at bedtime.   NAMZARIC 28-10 MG Cp24 Generic drug:  Memantine HCl-Donepezil HCl Take 1 capsule by mouth daily.   oxyCODONE-acetaminophen 5-325 MG tablet Commonly known as:  PERCOCET/ROXICET Take 1 tablet by mouth every 6 (six) hours as needed for moderate pain.   polyethylene glycol packet Commonly known as:  MIRALAX Take 17 g by mouth daily.        DISCHARGE INSTRUCTIONS:  See AVS.  If you experience worsening of your admission symptoms, develop shortness of breath, life threatening emergency, suicidal or homicidal thoughts you must seek medical attention immediately by calling 911 or  calling your MD immediately  if symptoms less severe.  You Must read complete instructions/literature along with all the possible adverse reactions/side effects for all the Medicines you take and that have been prescribed to you. Take any new Medicines after you have completely understood and accpet all the possible adverse reactions/side effects.   Please note  You were cared for by a hospitalist during  your hospital stay. If you have any questions about your discharge medications or the care you received while you were in the hospital after you are discharged, you can call the unit and asked to speak with the hospitalist on call if the hospitalist that took care of you is not available. Once you are discharged, your primary care physician will handle any further medical issues. Please note that NO REFILLS for any discharge medications will be authorized once you are discharged, as it is imperative that you return to your primary care physician (or establish a relationship with a primary care physician if you do not have one) for your aftercare needs so that they can reassess your need for medications and monitor your lab values.    On the day of Discharge:  VITAL SIGNS:  Blood pressure 121/65, pulse 93, temperature 98.2 F (36.8 C), temperature source Oral, resp. rate 18, height '5\' 9"'$  (1.753 m), weight 145 lb 1.6 oz (65.8 kg), SpO2 96 %. PHYSICAL EXAMINATION:  GENERAL:  83 y.o.-year-old patient lying in the bed with no acute distress. Thin. EYES: Pupils equal, round, reactive to light and accommodation. No scleral icterus. Extraocular muscles intact.  HEENT: Head atraumatic, normocephalic. Oropharynx and nasopharynx clear.  NECK:  Supple, no jugular venous distention. No thyroid enlargement, no tenderness.  LUNGS: Normal breath sounds bilaterally, no wheezing, rales,rhonchi or crepitation. No use of accessory muscles of respiration.  CARDIOVASCULAR: S1, S2 normal. No murmurs, rubs, or gallops.  ABDOMEN: Soft, non-tender, non-distended. Bowel sounds present. No organomegaly or mass.  EXTREMITIES: No pedal edema, cyanosis, or clubbing.  NEUROLOGIC: Cranial nerves II through XII are intact. Muscle strength 4/5 in all extremities. Sensation intact. Gait not checked.  PSYCHIATRIC: The patient is alert and oriented x 2-3, demented. SKIN: No obvious rash, lesion, or ulcer.  DATA REVIEW:    CBC  Recent Labs Lab 01/29/17 0457  WBC 7.6  HGB 15.0  HCT 45.7  PLT 147*    Chemistries   Recent Labs Lab 01/28/17 1411  01/30/17 0449  NA 136  < > 136  K 5.1  < > 4.2  CL 106  < > 107  CO2 22  < > 23  GLUCOSE 99  < > 94  BUN 22*  < > 22*  CREATININE 1.18  < > 1.09  CALCIUM 9.4  < > 8.5*  AST 107*  --   --   ALT 70*  --   --   ALKPHOS 152*  --   --   BILITOT 1.2  --   --   < > = values in this interval not displayed.   Microbiology Results  Results for orders placed or performed during the hospital encounter of 01/23/17  Urine culture     Status: None   Collection Time: 01/23/17  3:05 PM  Result Value Ref Range Status   Specimen Description URINE, RANDOM  Final   Special Requests NONE  Final   Culture   Final    NO GROWTH Performed at [facility], [place] [address], [place], [place] [address]  Report Status 01/24/2017 FINAL  Final    RADIOLOGY:  No results found.   Management plans discussed with the patient, his daughters and they are in agreement.  CODE STATUS: Prior   TOTAL TIME TAKING CARE OF THIS PATIENT: 36 minutes.    Demetrios Loll M.D on 01/31/2017 at 4:20 PM  Between 7am to 6pm - Pager - [phone]  After 6pm go to [email or web address] - Proofreader  [employer] [place] [facility]  Office  [phone]  CC: Primary care physician; Marden Noble, MD   Note: This dictation was prepared with Dragon dictation along with smaller phrase technology. Any transcriptional errors that result from this process are unintentional.

## 2017-01-31 NOTE — Progress Notes (Signed)
Initial Nutrition Assessment  DOCUMENTATION CODES:   Severe malnutrition in context of chronic illness  INTERVENTION:  Recommend Ensure Enlive po BID, each supplement provides 350 kcal and 20 grams of protein. Discussed with patient.   NUTRITION DIAGNOSIS:   Malnutrition (Severe) related to chronic illness (lung cancer) as evidenced by severe depletion of body fat, severe depletion of muscle mass. GOAL:   Patient will meet greater than or equal to 90% of their needs  MONITOR:   PO intake, Supplement acceptance, Labs, Weight trends, I & O's  REASON FOR ASSESSMENT:   Consult Assessment of nutrition requirement/status  ASSESSMENT:   83 year old male with PMHx of dementia, HTN, atrial fibrillation with RVR, lung cancer with metastasis found to have fecal impaction in rectum.   -Patient found to have lung cancer with mets to bone, liver, lymph nodes. Initial consultation with oncology was 01/18/2017. -Patient now s/p manual disimpaction and multiple enemas. -Patient discharging home today with [facility] home [facility] for skilled nursing, Pt, and home palliative.  Spoke with patient at bedside. He reports he has had a poor appetite for a few months now. Patient reports he has been attempting to eat 3 meals per day but is finishing <50% of meals. Patient unable to recall more specifics on intake. He endorses abdominal pain PTA due to constipation, but reports he feels better now and his appetite is now improved.   UBW 160 lbs. Per chart patient was 162.5 lbs on 2/28 but may have been inaccurate as earlier weights were lower. Patient has lost 17.5 lbs (10.8% body weight) over 2 weeks per chart, but likely over a longer period per patient's report of poor intake for a few months.   Meal Completion: 75-100%  Medications reviewed and include: Colace, Remeron, Miralax.  Labs reviewed: BUN 22.  Nutrition-Focused physical exam completed. Findings are severe fat depletion, severe muscle  depletion, and no edema.   Diet Order:  Diet Heart Room service appropriate? Yes; Fluid consistency: Thin Diet - low sodium heart healthy Diet - low sodium heart healthy  Skin:  Reviewed, no issues  Last BM:  01/30/2017  Height:   Ht Readings from Last 1 Encounters:  01/28/17 '5\' 9"'$  (1.753 m)    Weight:   Wt Readings from Last 1 Encounters:  01/28/17 145 lb 1.6 oz (65.8 kg)    Ideal Body Weight:  72.7 kg  BMI:  Body mass index is 21.43 kg/m.  Estimated Nutritional Needs:   Kcal:  1975-2175 (30-33 kcal/kg)  Protein:  80-100 grams (1.2-1.5 grams/kg)  Fluid:  1.6 L/day (25 ml/kg)  EDUCATION NEEDS:   No education needs identified at this time  Willey Blade, MS, RD, LDN Pager: [phone] After Hours Pager: [phone]

## 2017-01-31 NOTE — Progress Notes (Signed)
[facility] Cardiology  SUBJECTIVE: Patient resting in bed. Patient had an episode of abdominal pain this morning with radiation to his chest, which is unchanged from before. Denies shortness of breath, palpitations.   Vitals:   01/30/17 2009 01/31/17 0442 01/31/17 0443 01/31/17 0739  BP: (!) 122/58 134/78  121/65  Pulse: 61 77 75 93  Resp: '20 18  18  '$ Temp: 98.4 F (36.9 C) 98.2 F (36.8 C)    TempSrc: Oral Oral    SpO2: 96%  97% 96%  Weight:      Height:         Intake/Output Summary (Last 24 hours) at 01/31/17 6440 Last data filed at 01/30/17 1858  Gross per 24 hour  Intake              480 ml  Output                0 ml  Net              480 ml      PHYSICAL EXAM  General: Well developed, well nourished, in no acute distress HEENT:  Normocephalic and atramatic Neck:  No JVD.  Lungs: Clear bilaterally to auscultation and percussion. Heart: HRRR . Normal S1 and S2 without gallops or murmurs.  Abdomen: Bowel sounds are positive, abdomen soft and non-tender  Msk:  Back normal, normal gait. Normal strength and tone for age. Extremities: No clubbing, cyanosis or edema.   Neuro: Alert and oriented X 3. Psych:  Good affect, responds appropriately   LABS: Basic Metabolic Panel:  Recent Labs  01/29/17 1340 01/30/17 0449  NA 139 136  K 4.4 4.2  CL 108 107  CO2 24 23  GLUCOSE 143* 94  BUN 28* 22*  CREATININE 1.30* 1.09  CALCIUM 8.7* 8.5*   Liver Function Tests:  Recent Labs  01/28/17 1411  AST 107*  ALT 70*  ALKPHOS 152*  BILITOT 1.2  PROT 7.7  ALBUMIN 3.0*   No results for input(s): LIPASE, AMYLASE in the last 72 hours. CBC:  Recent Labs  01/28/17 1411 01/29/17 0457  WBC 6.1 7.6  NEUTROABS 4.2  --   HGB 15.5 15.0  HCT 47.5 45.7  MCV 73.5* 73.9*  PLT 136* 147*   Cardiac Enzymes:  Recent Labs  01/28/17 1411 01/30/17 1452  TROPONINI <0.03 <0.03   BNP: Invalid input(s): POCBNP D-Dimer: No results for input(s): DDIMER in the last 72  hours. Hemoglobin A1C: No results for input(s): HGBA1C in the last 72 hours. Fasting Lipid Panel: No results for input(s): CHOL, HDL, LDLCALC, TRIG, CHOLHDL, LDLDIRECT in the last 72 hours. Thyroid Function Tests:  Recent Labs  01/28/17 1411  TSH 10.482*   Anemia Panel: No results for input(s): VITAMINB12, FOLATE, FERRITIN, TIBC, IRON, RETICCTPCT in the last 72 hours.  No results found.   Echo: LV EF 65% moderate AR/MR, per echo 08/24/2016    ASSESSMENT AND PLAN:  Principal Problem:   Fecal impaction in rectum ([place]) Active Problems:   Dementia   HTN (hypertension)   Atrial fibrillation (HCC)   Dehydration    1. Chest pain with atypical features likely of GI origin 2. Paroxysmal atrial fib, stable on low dose aspirin 3. Episodic Mobitz I, second degree AV block of uncertain clinical significance  Recommendations 1. Agree with current therapy 2. Continue metoprolol at current dose 3. Continue low dose aspirin 4. No further cardiac diagnostics at this time   Sign off for now. Call with any questions.  Clabe Seal, PA-C 01/31/2017 9:06 AM

## 2017-01-31 NOTE — Care Management (Signed)
Physical therapy evaluation completed. Recommends home with home health and physical therapy. Discussed services in the home with daughter, [name]. Chose [facility]. Will update Flo Shanks, RN representative for [employer] . Requested Palliative Care in the home.  Will be going to daughter's home. Daughter is Hilario Quarry. Address: [place] [address], Telephone: [phone]. Discharge to home today per Dr. Haydee Monica RN MSN CCM Care Management

## 2017-01-31 NOTE — Progress Notes (Signed)
New referral for [place] services of skilled nursing and physical therapy as well as Home Palliative received from [facility]. Mr. Wayne Le is an 83 year old man admitted to [facility] on 3/10 for treatment of a fecal impaction. His PMH includes metastatic lung cancer, HTN, arthritis and a prostate disorder. He was to be discharged home on 3/11 but developed new onset chest pain with atrial fibrillation, evaluated by cardiology and will be followed as an put patient, 81 mg of aspirin added. Patient will be discharging home today to live with [name] [address] [place] [address]. Wayne Le will be the primary caregiver along with her sister Wayne Le. Patient seen sitting up in bed, alert, both daughters at bedside. [facility] information given. Writer made Wayne Le and Wayne Le aware that services will not begin until next week. CMRN Wayne Le also made aware. Patient information faxed to referral. Discharge summary to be amended to include Home Palliative. Thank you for the opportunity to be involved in the care of this patient and his family. Flo Shanks RN, BSN, [facility] home [facility], hospital liaison [phone] c

## 2017-01-31 NOTE — Progress Notes (Signed)
Patient c/o chest pain 8/10  this am. Pain radiating in the left chest area.  Patient did state he had been coughing prior to chest pain occurring . Vital signs taken.  Dr Bridgett Larsson paged.

## 2017-01-31 NOTE — Discharge Instructions (Signed)
Heart healthy diet. Fall precaution. HHPT  SN and home Palliative care

## 2017-02-02 ENCOUNTER — Ambulatory Visit
Admission: RE | Admit: 2017-02-02 | Discharge: 2017-02-02 | Disposition: A | Discharge status: Home / Self Care | Payer: Medicare HMO | Source: Ambulatory Visit | Attending: Hematology and Oncology | Admitting: Hematology and Oncology

## 2017-02-02 DIAGNOSIS — M1990 Unspecified osteoarthritis, unspecified site: Secondary | ICD-10-CM

## 2017-02-02 DIAGNOSIS — C3431 Malignant neoplasm of lower lobe, right bronchus or lung: Secondary | ICD-10-CM | POA: Insufficient documentation

## 2017-02-02 DIAGNOSIS — Z7982 Long term (current) use of aspirin: Secondary | ICD-10-CM | POA: Insufficient documentation

## 2017-02-02 DIAGNOSIS — Z880 Allergy status to penicillin: Secondary | ICD-10-CM

## 2017-02-02 DIAGNOSIS — E860 Dehydration: Secondary | ICD-10-CM | POA: Diagnosis not present

## 2017-02-02 DIAGNOSIS — C787 Secondary malignant neoplasm of liver and intrahepatic bile duct: Secondary | ICD-10-CM

## 2017-02-02 DIAGNOSIS — F17210 Nicotine dependence, cigarettes, uncomplicated: Secondary | ICD-10-CM | POA: Insufficient documentation

## 2017-02-02 DIAGNOSIS — C3491 Malignant neoplasm of unspecified part of right bronchus or lung: Secondary | ICD-10-CM | POA: Diagnosis not present

## 2017-02-02 DIAGNOSIS — K769 Liver disease, unspecified: Secondary | ICD-10-CM

## 2017-02-02 DIAGNOSIS — I4891 Unspecified atrial fibrillation: Secondary | ICD-10-CM | POA: Insufficient documentation

## 2017-02-02 DIAGNOSIS — Z79899 Other long term (current) drug therapy: Secondary | ICD-10-CM | POA: Insufficient documentation

## 2017-02-02 DIAGNOSIS — F0390 Unspecified dementia without behavioral disturbance: Secondary | ICD-10-CM | POA: Insufficient documentation

## 2017-02-02 HISTORY — DX: Cardiac arrhythmia, unspecified: I49.9

## 2017-02-02 LAB — PROTIME-INR
INR: 1.21
PROTHROMBIN TIME: 15.4 s — AB (ref 11.4–15.2)

## 2017-02-02 LAB — CBC
HCT: 44.3 % (ref 40.0–52.0)
Hemoglobin: 14.3 g/dL (ref 13.0–18.0)
MCH: 24.2 pg — AB (ref 26.0–34.0)
MCHC: 32.3 g/dL (ref 32.0–36.0)
MCV: 75 fL — ABNORMAL LOW (ref 80.0–100.0)
PLATELETS: 154 10*3/uL (ref 150–440)
RBC: 5.91 MIL/uL — ABNORMAL HIGH (ref 4.40–5.90)
RDW: 15.4 % — ABNORMAL HIGH (ref 11.5–14.5)
WBC: 5.5 10*3/uL (ref 3.8–10.6)

## 2017-02-02 LAB — APTT: aPTT: 40 seconds — ABNORMAL HIGH (ref 24–36)

## 2017-02-02 MED ORDER — HYDROCODONE-ACETAMINOPHEN 5-325 MG PO TABS
1.0000 | ORAL_TABLET | ORAL | Status: DC | PRN
  Filled 2017-02-02: qty 2

## 2017-02-02 MED ORDER — FENTANYL CITRATE (PF) 100 MCG/2ML IJ SOLN
INTRAMUSCULAR | Status: AC
  Filled 2017-02-02: qty 2

## 2017-02-02 MED ORDER — SODIUM CHLORIDE 0.9 % IV SOLN
INTRAVENOUS | Status: DC
  Administered 2017-02-02: 08:00:00 via INTRAVENOUS

## 2017-02-02 MED ORDER — MIDAZOLAM HCL 5 MG/5ML IJ SOLN
INTRAMUSCULAR | Status: AC
  Filled 2017-02-02: qty 5

## 2017-02-02 MED ORDER — FENTANYL CITRATE (PF) 100 MCG/2ML IJ SOLN
INTRAMUSCULAR | Status: AC | PRN
  Administered 2017-02-02: 25 ug via INTRAVENOUS

## 2017-02-02 NOTE — H&P (Signed)
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 arising from  the lower pole, which was present on the prior CT. IMPRESSION: 1. Numerous liver masses are seen extending throughout the liver with borderline hepatomegaly. Findings are consistent with widespread hepatic metastatic disease. Recommend follow-up CT scan of the chest, abdomen and pelvis to assess for a primary malignancy. This would be best performed with oral and intravenous contrast if this patient can tolerate contrast. 2. No acute findings. No gallstones or evidence of acute cholecystitis. No bile duct dilation. Electronically Signed   By: Lajean Manes M.D.   On: 01/15/2017 18:07    Labs:  CBC:  Recent Labs  01/23/17 1724 01/28/17 1411 01/29/17 0457 02/02/17 0713  WBC 6.1 6.1 7.6 5.5  HGB 15.0 15.5 15.0 14.3  HCT 46.9 47.5 45.7 44.3  PLT 155 136* 147* 154    COAGS:  Recent Labs  08/24/16 1135 02/02/17 0713  INR 1.20 1.21  APTT 42* 40*    BMP:  Recent Labs  01/28/17 1411 01/29/17 0457 01/29/17 1340 01/30/17 0449  NA 136 136 139 136  K 5.1 5.5* 4.4 4.2  CL 106 105 108 107  CO2 '22 23 24 23  '$ GLUCOSE 99 110* 143* 94  BUN 22* 26* 28* 22*  CALCIUM 9.4 9.2 8.7* 8.5*  CREATININE 1.18 1.32* 1.30* 1.09    GFRNONAA 54* 47* 48* 59*  GFRAA >60 54* 55* >60    LIVER FUNCTION TESTS:  Recent Labs  12/12/16 1553 01/15/17 1452 01/23/17 1724 01/28/17 1411  BILITOT 0.7 0.8 0.8 1.2  AST 50* 88* 111* 107*  ALT 38 65* 79* 70*  ALKPHOS 93 180* 189* 152*  PROT 8.5* 7.9 8.2* 7.7  ALBUMIN 3.6 3.1* 3.2* 3.0*    Assessment and Plan:  Probable stage IV lung CA by imaging.  For US guided core biopsy of liver lesion(s) today to establish tissue diagnosis.    Risks and benefits discussed with the patient and his daughter including, but not limited to bleeding, infection, damage to adjacent structures or low yield requiring additional tests. All of the patient's questions were answered, patient is agreeable to proceed. Consent signed and in chart.  Thank you for this interesting consult.  I greatly enjoyed meeting BRODEN HOLT and look forward to participating in their care.  A copy of this report was sent to the requesting provider on this date.  Electronically SignedAletta Edouard T 02/02/2017, 8:30 AM   I spent a total of 15 Minutes in face to face in clinical consultation, greater than 50% of which was counseling/coordinating care for liver biopsy.

## 2017-02-02 NOTE — Procedures (Signed)
Interventional Radiology Procedure Note  Procedure:  US guided liver biopsy  Complications: None  Estimated Blood Loss: < 10 mL  Findings:  Diffuse liver lesions.  32 G needle advanced into right lobe of liver. 18 G core biopsy x 3. Gelfoam tract embo.  Venetia Night. Kathlene Cote, M.D Pager:  [phone]

## 2017-02-04 ENCOUNTER — Emergency Department: Payer: Medicare HMO

## 2017-02-04 ENCOUNTER — Inpatient Hospital Stay
Admission: EM | Admit: 2017-02-04 | Discharge: 2017-02-08 | DRG: 180 | Disposition: A | Discharge status: Hospice | Payer: Medicare HMO | Attending: Internal Medicine | Admitting: Internal Medicine

## 2017-02-04 ENCOUNTER — Encounter: Payer: Self-pay | Admitting: Emergency Medicine

## 2017-02-04 DIAGNOSIS — R52 Pain, unspecified: Secondary | ICD-10-CM

## 2017-02-04 DIAGNOSIS — Z87898 Personal history of other specified conditions: Secondary | ICD-10-CM

## 2017-02-04 DIAGNOSIS — E871 Hypo-osmolality and hyponatremia: Secondary | ICD-10-CM

## 2017-02-04 DIAGNOSIS — E872 Acidosis, unspecified: Secondary | ICD-10-CM

## 2017-02-04 DIAGNOSIS — R627 Adult failure to thrive: Secondary | ICD-10-CM | POA: Diagnosis present

## 2017-02-04 DIAGNOSIS — E039 Hypothyroidism, unspecified: Secondary | ICD-10-CM | POA: Diagnosis present

## 2017-02-04 DIAGNOSIS — G9340 Encephalopathy, unspecified: Secondary | ICD-10-CM | POA: Diagnosis present

## 2017-02-04 DIAGNOSIS — R109 Unspecified abdominal pain: Secondary | ICD-10-CM

## 2017-02-04 DIAGNOSIS — Z79899 Other long term (current) drug therapy: Secondary | ICD-10-CM

## 2017-02-04 DIAGNOSIS — N179 Acute kidney failure, unspecified: Secondary | ICD-10-CM | POA: Diagnosis present

## 2017-02-04 DIAGNOSIS — F0390 Unspecified dementia without behavioral disturbance: Secondary | ICD-10-CM | POA: Diagnosis present

## 2017-02-04 DIAGNOSIS — R740 Nonspecific elevation of levels of transaminase and lactic acid dehydrogenase [LDH]: Secondary | ICD-10-CM | POA: Diagnosis present

## 2017-02-04 DIAGNOSIS — C3490 Malignant neoplasm of unspecified part of unspecified bronchus or lung: Secondary | ICD-10-CM

## 2017-02-04 DIAGNOSIS — C3491 Malignant neoplasm of unspecified part of right bronchus or lung: Principal | ICD-10-CM | POA: Diagnosis present

## 2017-02-04 DIAGNOSIS — I129 Hypertensive chronic kidney disease with stage 1 through stage 4 chronic kidney disease, or unspecified chronic kidney disease: Secondary | ICD-10-CM | POA: Diagnosis present

## 2017-02-04 DIAGNOSIS — K5909 Other constipation: Secondary | ICD-10-CM | POA: Diagnosis present

## 2017-02-04 DIAGNOSIS — N183 Chronic kidney disease, stage 3 (moderate): Secondary | ICD-10-CM | POA: Diagnosis present

## 2017-02-04 DIAGNOSIS — E43 Unspecified severe protein-calorie malnutrition: Secondary | ICD-10-CM | POA: Diagnosis present

## 2017-02-04 DIAGNOSIS — Z66 Do not resuscitate: Secondary | ICD-10-CM

## 2017-02-04 DIAGNOSIS — Z7982 Long term (current) use of aspirin: Secondary | ICD-10-CM

## 2017-02-04 DIAGNOSIS — Z515 Encounter for palliative care: Secondary | ICD-10-CM | POA: Diagnosis present

## 2017-02-04 DIAGNOSIS — F17210 Nicotine dependence, cigarettes, uncomplicated: Secondary | ICD-10-CM | POA: Diagnosis present

## 2017-02-04 DIAGNOSIS — R7401 Elevation of levels of liver transaminase levels: Secondary | ICD-10-CM

## 2017-02-04 DIAGNOSIS — C7951 Secondary malignant neoplasm of bone: Secondary | ICD-10-CM | POA: Diagnosis present

## 2017-02-04 DIAGNOSIS — C787 Secondary malignant neoplasm of liver and intrahepatic bile duct: Secondary | ICD-10-CM | POA: Diagnosis present

## 2017-02-04 DIAGNOSIS — E222 Syndrome of inappropriate secretion of antidiuretic hormone: Secondary | ICD-10-CM | POA: Diagnosis present

## 2017-02-04 DIAGNOSIS — I4891 Unspecified atrial fibrillation: Secondary | ICD-10-CM | POA: Diagnosis present

## 2017-02-04 DIAGNOSIS — M1990 Unspecified osteoarthritis, unspecified site: Secondary | ICD-10-CM | POA: Diagnosis present

## 2017-02-04 DIAGNOSIS — I2510 Atherosclerotic heart disease of native coronary artery without angina pectoris: Secondary | ICD-10-CM | POA: Diagnosis present

## 2017-02-04 DIAGNOSIS — E860 Dehydration: Secondary | ICD-10-CM

## 2017-02-04 DIAGNOSIS — Z6820 Body mass index (BMI) 20.0-20.9, adult: Secondary | ICD-10-CM

## 2017-02-04 DIAGNOSIS — Z880 Allergy status to penicillin: Secondary | ICD-10-CM

## 2017-02-04 LAB — COMPREHENSIVE METABOLIC PANEL
ALT: 105 U/L — ABNORMAL HIGH (ref 17–63)
ANION GAP: 10 (ref 5–15)
AST: 185 U/L — AB (ref 15–41)
Albumin: 2.9 g/dL — ABNORMAL LOW (ref 3.5–5.0)
Alkaline Phosphatase: 230 U/L — ABNORMAL HIGH (ref 38–126)
BUN: 28 mg/dL — ABNORMAL HIGH (ref 6–20)
CALCIUM: 9.8 mg/dL (ref 8.9–10.3)
CHLORIDE: 102 mmol/L (ref 101–111)
CO2: 22 mmol/L (ref 22–32)
CREATININE: 1.4 mg/dL — AB (ref 0.61–1.24)
GFR, EST AFRICAN AMERICAN: 51 mL/min — AB (ref >60)
GFR, EST NON AFRICAN AMERICAN: 44 mL/min — AB (ref >60)
Glucose, Bld: 222 mg/dL — ABNORMAL HIGH (ref 65–99)
Potassium: 4.7 mmol/L (ref 3.5–5.1)
Sodium: 134 mmol/L — ABNORMAL LOW (ref 135–145)
Total Bilirubin: 1.2 mg/dL (ref 0.3–1.2)
Total Protein: 7.9 g/dL (ref 6.5–8.1)

## 2017-02-04 LAB — CBC
HCT: 46.5 % (ref 40.0–52.0)
HEMOGLOBIN: 15.1 g/dL (ref 13.0–18.0)
MCH: 24.1 pg — ABNORMAL LOW (ref 26.0–34.0)
MCHC: 32.5 g/dL (ref 32.0–36.0)
MCV: 74.2 fL — ABNORMAL LOW (ref 80.0–100.0)
PLATELETS: 171 10*3/uL (ref 150–440)
RBC: 6.28 MIL/uL — AB (ref 4.40–5.90)
RDW: 15.2 % — ABNORMAL HIGH (ref 11.5–14.5)
WBC: 7.1 10*3/uL (ref 3.8–10.6)

## 2017-02-04 LAB — TSH: TSH: 16.35 u[IU]/mL — ABNORMAL HIGH (ref 0.350–4.500)

## 2017-02-04 LAB — TROPONIN I

## 2017-02-04 LAB — T4, FREE: Free T4: 1.19 ng/dL — ABNORMAL HIGH (ref 0.61–1.12)

## 2017-02-04 LAB — LACTIC ACID, PLASMA: LACTIC ACID, VENOUS: 2.8 mmol/L — AB (ref 0.5–1.9)

## 2017-02-04 MED ORDER — SODIUM CHLORIDE 0.9 % IV BOLUS (SEPSIS)
1000.0000 mL | Freq: Once | INTRAVENOUS | Status: AC
  Administered 2017-02-04: 1000 mL via INTRAVENOUS

## 2017-02-04 NOTE — ED Provider Notes (Signed)
[facility] Emergency Department Provider Note  ____________________________________________   First MD Initiated Contact with Patient 02/04/17 2301     (approximate)  I have reviewed the triage vital signs and the nursing notes.   HISTORY  Chief Complaint Altered Mental Status    HPI Wayne Le is a 83 y.o. male who is brought to the emergency department by his daughter for dementia. History is obtained from his daughter as the patient is severely demented. Recently admitted to our hospital and discharged home earlier this week, however the daughter says that she is no longer able to care for him on her own. He has decreased oral intake and requires a significant amount of care which she cannot provide. He has a history of stage IV metastatic lung cancer and most recently had a liver biopsy done 2 days ago. He has received no chemotherapy, no radiation, and nose surgery. He is DNR/DNI.The daughter says that nothing in particular is different, she just cannot care for him on her own. The patient has been chronically constipated but did have a bowel movement yesterday.   Past Medical History:  Diagnosis Date  . Arthritis   . Cancer ([place])   . Cataract   . Dysrhythmia    Atrial fib  . Hypertension   . Memory changes   . Prostate disorder     Patient Active Problem List   Diagnosis Date Noted  . Failure to thrive in adult 02/05/2017  . Constipation 01/28/2017  . Fecal impaction in rectum ([place]) 01/28/2017  . Dementia 01/28/2017  . HTN (hypertension) 01/28/2017  . Atrial fibrillation ([place]) 01/28/2017  . Dehydration 01/28/2017  . Bone metastasis ([place]) 01/27/2017  . Right lower lobe lung mass 01/18/2017  . Mediastinal adenopathy 01/18/2017  . Liver lesion 01/18/2017  . Balance problems 01/18/2017  . Sepsis ([place]) 08/24/2016  . Acute gastroenteritis 08/24/2016  . Community acquired pneumonia 08/24/2016  . Acute renal insufficiency 08/24/2016  .  Hyperbilirubinemia 08/24/2016  . Elevated troponin 08/24/2016    Past Surgical History:  Procedure Laterality Date  . HERNIA REPAIR      Prior to Admission medications   Medication Sig Start Date End Date Taking? Authorizing Provider  aspirin EC 81 MG tablet Take 81 mg by mouth daily.   Yes Historical Provider, MD  docusate sodium (COLACE) 50 MG capsule Take 50 mg by mouth daily.   Yes Historical Provider, MD  dutasteride (AVODART) 0.5 MG capsule Take 1 capsule by mouth daily. 08/08/16  Yes Historical Provider, MD  metoprolol tartrate (LOPRESSOR) 25 MG tablet Take 1 tablet (25 mg total) by mouth 2 (two) times daily. 01/30/17  Yes Demetrios Loll, MD  mirtazapine (REMERON) 15 MG tablet Take 15 mg by mouth at bedtime.   Yes Historical Provider, MD  NAMZARIC 28-10 MG CP24 Take 1 capsule by mouth daily. 07/25/16  Yes Historical Provider, MD  oxyCODONE-acetaminophen (PERCOCET/ROXICET) 5-325 MG tablet Take 1 tablet by mouth every 6 (six) hours as needed for moderate pain. 01/31/17  Yes Demetrios Loll, MD  polyethylene glycol [facility]) packet Take 17 g by mouth daily. 01/27/17  Yes Gregor Hams, MD    Allergies Iodine; Other; and Penicillins  Family History  Problem Relation Age of Onset  . Cancer Brother     Social History Social History  Substance Use Topics  . Smoking status: Current Every Day Smoker    Packs/day: 0.25  . Smokeless tobacco: Never Used     Comment: Pack lasts one  week  . Alcohol use No    Review of Systems Level V exemption history Limited by the patient's dementia 10-point ROS otherwise negative.  ____________________________________________   PHYSICAL EXAM:  VITAL SIGNS: ED Triage Vitals  Enc Vitals Group     BP 02/04/17 2136 (!) 127/58     Pulse Rate 02/04/17 2136 (!) 101     Resp 02/04/17 2136 18     Temp 02/04/17 2136 98.7 F (37.1 C)     Temp Source 02/04/17 2136 Oral     SpO2 02/04/17 2136 97 %     Weight 02/04/17 2136 141 lb (64 kg)     Height 02/04/17  2136 '5\' 11"'$  (1.803 m)     Head Circumference --      Peak Flow --      Pain Score 02/04/17 2152 0     Pain Loc --      Pain Edu? --      Excl. in [place]? --     Constitutional: Cachectic elevated respiratory rate appears chronically sick Eyes: Right pupil is pinpoint left pupil is 3 mm to 2 mm round and brisk Head: Atraumatic. Nose: No congestion/rhinnorhea. Mouth/Throat: No trismus Neck: No stridor.   Cardiovascular: Tachycardic rate, regular rhythm. Grossly normal heart sounds.  Good peripheral circulation. Respiratory: Elevated respiratory effort.  No retractions. Lungs CTAB and moving good air Gastrointestinal: Soft nondistended nontender no rebound no guarding no peritonitis no McBurney's tenderness negative Rovsing's no costovertebral tenderness negative Murphy's Musculoskeletal: No lower extremity edema   Neurologic:   No gross focal neurologic deficits are appreciated. Skin:  Skin is warm, dry and intact. No rash noted. Psychiatric: Significant dementia    ____________________________________________   DIFFERENTIAL  Dehydration, metabolic drainage when, urinary tract infection, sepsis, intracerebral hemorrhage, stroke ____________________________________________   LABS (all labs ordered are listed, but only abnormal results are displayed)  Labs Reviewed  COMPREHENSIVE METABOLIC PANEL - Abnormal; Notable for the following:       Result Value   Sodium 134 (*)    Glucose, Bld 222 (*)    BUN 28 (*)    Creatinine, Ser 1.40 (*)    Albumin 2.9 (*)    AST 185 (*)    ALT 105 (*)    Alkaline Phosphatase 230 (*)    GFR calc non Af Amer 44 (*)    GFR calc Af Amer 51 (*)    All other components within normal limits  CBC - Abnormal; Notable for the following:    RBC 6.28 (*)    MCV 74.2 (*)    MCH 24.1 (*)    RDW 15.2 (*)    All other components within normal limits  URINALYSIS, COMPLETE (UACMP) WITH MICROSCOPIC - Abnormal; Notable for the following:    Color, Urine  AMBER (*)    APPearance CLEAR (*)    Bacteria, UA RARE (*)    Squamous Epithelial / LPF 0-5 (*)    All other components within normal limits  LACTIC ACID, PLASMA - Abnormal; Notable for the following:    Lactic Acid, Venous 2.8 (*)    All other components within normal limits  LACTIC ACID, PLASMA - Abnormal; Notable for the following:    Lactic Acid, Venous 2.1 (*)    All other components within normal limits  TSH - Abnormal; Notable for the following:    TSH 16.350 (*)    All other components within normal limits  T4, FREE - Abnormal; Notable for the following:  Free T4 1.19 (*)    All other components within normal limits  CULTURE, BLOOD (ROUTINE X 2)  CULTURE, BLOOD (ROUTINE X 2)  TROPONIN I  BASIC METABOLIC PANEL  CBC  CBG MONITORING, ED    Elevated lactate very nonspecific __________________________________________  EKG  ED ECG REPORT I, Darel Hong, the attending physician, personally viewed and interpreted this ECG.  Date: 02/04/2017 Rate: 77 Rhythm: Atrial fibrillation QRS Axis: normal Intervals: normal ST/T Wave abnormalities: normal Conduction Disturbances: none Narrative Interpretation: Abnormal  ____________________________________________  RADIOLOGY  No acute disease ____________________________________________   PROCEDURES  Procedure(s) performed: no  Procedures  Critical Care performed: no  ____________________________________________   INITIAL IMPRESSION / ASSESSMENT AND PLAN / ED COURSE  Pertinent labs & imaging results that were available during my care of the patient were reviewed by me and considered in my medical decision making (see chart for details).  The patient arrives chronically ill with no acute issues. The patient's daughter is unable to care for him at home. His first lactate came back slightly elevated and he does have acute kidney injury. This might represent dehydration versus nonspecific sepsis so I gave her a  gram of ceftriaxone. I discussed the case with the on-call hospitalist who is graciously agreed to admit the patient to her service.      ____________________________________________   FINAL CLINICAL IMPRESSION(S) / ED DIAGNOSES  Final diagnoses:  Pain  Acute kidney injury ([place])  Dementia without behavioral disturbance, unspecified dementia type  Dehydration      NEW MEDICATIONS STARTED DURING THIS VISIT:  Current Discharge Medication List       Note:  This document was prepared using Dragon voice recognition software and may include unintentional dictation errors.     Darel Hong, MD 02/05/17 [phone]

## 2017-02-04 NOTE — ED Triage Notes (Signed)
Pt with daughters, pt recently discharged with SBO this past week. Pt has not been eating since discharge, increased weakness, difficulty walking, increased incontinence of urine/stool.  x1 year of progressive mental status changes.

## 2017-02-04 NOTE — ED Notes (Signed)
Pt's family very concerned regarding wait time for MD. Family updated on process of evaluation of pt's in ed. Family verbalizes understanding.

## 2017-02-05 ENCOUNTER — Encounter: Payer: Self-pay | Admitting: *Deleted

## 2017-02-05 ENCOUNTER — Emergency Department: Payer: Medicare HMO

## 2017-02-05 DIAGNOSIS — Z515 Encounter for palliative care: Secondary | ICD-10-CM | POA: Diagnosis present

## 2017-02-05 DIAGNOSIS — Z79899 Other long term (current) drug therapy: Secondary | ICD-10-CM

## 2017-02-05 DIAGNOSIS — C3490 Malignant neoplasm of unspecified part of unspecified bronchus or lung: Secondary | ICD-10-CM | POA: Diagnosis not present

## 2017-02-05 DIAGNOSIS — I2510 Atherosclerotic heart disease of native coronary artery without angina pectoris: Secondary | ICD-10-CM | POA: Diagnosis present

## 2017-02-05 DIAGNOSIS — M129 Arthropathy, unspecified: Secondary | ICD-10-CM

## 2017-02-05 DIAGNOSIS — Z66 Do not resuscitate: Secondary | ICD-10-CM | POA: Diagnosis present

## 2017-02-05 DIAGNOSIS — C3491 Malignant neoplasm of unspecified part of right bronchus or lung: Secondary | ICD-10-CM | POA: Diagnosis present

## 2017-02-05 DIAGNOSIS — R740 Nonspecific elevation of levels of transaminase and lactic acid dehydrogenase [LDH]: Secondary | ICD-10-CM | POA: Diagnosis present

## 2017-02-05 DIAGNOSIS — C799 Secondary malignant neoplasm of unspecified site: Secondary | ICD-10-CM | POA: Diagnosis not present

## 2017-02-05 DIAGNOSIS — C787 Secondary malignant neoplasm of liver and intrahepatic bile duct: Secondary | ICD-10-CM | POA: Diagnosis not present

## 2017-02-05 DIAGNOSIS — K5900 Constipation, unspecified: Secondary | ICD-10-CM | POA: Diagnosis not present

## 2017-02-05 DIAGNOSIS — I480 Paroxysmal atrial fibrillation: Secondary | ICD-10-CM | POA: Diagnosis not present

## 2017-02-05 DIAGNOSIS — I4891 Unspecified atrial fibrillation: Secondary | ICD-10-CM

## 2017-02-05 DIAGNOSIS — F0390 Unspecified dementia without behavioral disturbance: Secondary | ICD-10-CM | POA: Diagnosis present

## 2017-02-05 DIAGNOSIS — R627 Adult failure to thrive: Secondary | ICD-10-CM | POA: Diagnosis present

## 2017-02-05 DIAGNOSIS — I129 Hypertensive chronic kidney disease with stage 1 through stage 4 chronic kidney disease, or unspecified chronic kidney disease: Secondary | ICD-10-CM

## 2017-02-05 DIAGNOSIS — E43 Unspecified severe protein-calorie malnutrition: Secondary | ICD-10-CM | POA: Diagnosis present

## 2017-02-05 DIAGNOSIS — K5909 Other constipation: Secondary | ICD-10-CM | POA: Diagnosis present

## 2017-02-05 DIAGNOSIS — K56609 Unspecified intestinal obstruction, unspecified as to partial versus complete obstruction: Secondary | ICD-10-CM

## 2017-02-05 DIAGNOSIS — N429 Disorder of prostate, unspecified: Secondary | ICD-10-CM | POA: Diagnosis not present

## 2017-02-05 DIAGNOSIS — N183 Chronic kidney disease, stage 3 (moderate): Secondary | ICD-10-CM | POA: Diagnosis present

## 2017-02-05 DIAGNOSIS — R531 Weakness: Secondary | ICD-10-CM | POA: Diagnosis not present

## 2017-02-05 DIAGNOSIS — N189 Chronic kidney disease, unspecified: Secondary | ICD-10-CM | POA: Diagnosis not present

## 2017-02-05 DIAGNOSIS — Z7982 Long term (current) use of aspirin: Secondary | ICD-10-CM

## 2017-02-05 DIAGNOSIS — R52 Pain, unspecified: Secondary | ICD-10-CM | POA: Diagnosis not present

## 2017-02-05 DIAGNOSIS — E039 Hypothyroidism, unspecified: Secondary | ICD-10-CM | POA: Diagnosis present

## 2017-02-05 DIAGNOSIS — R944 Abnormal results of kidney function studies: Secondary | ICD-10-CM

## 2017-02-05 DIAGNOSIS — F17210 Nicotine dependence, cigarettes, uncomplicated: Secondary | ICD-10-CM

## 2017-02-05 DIAGNOSIS — Z7951 Long term (current) use of inhaled steroids: Secondary | ICD-10-CM

## 2017-02-05 DIAGNOSIS — N179 Acute kidney failure, unspecified: Secondary | ICD-10-CM | POA: Diagnosis present

## 2017-02-05 DIAGNOSIS — R4182 Altered mental status, unspecified: Secondary | ICD-10-CM | POA: Diagnosis not present

## 2017-02-05 DIAGNOSIS — E860 Dehydration: Secondary | ICD-10-CM | POA: Diagnosis present

## 2017-02-05 DIAGNOSIS — C7951 Secondary malignant neoplasm of bone: Secondary | ICD-10-CM | POA: Diagnosis not present

## 2017-02-05 DIAGNOSIS — C3431 Malignant neoplasm of lower lobe, right bronchus or lung: Secondary | ICD-10-CM | POA: Diagnosis not present

## 2017-02-05 DIAGNOSIS — G9340 Encephalopathy, unspecified: Secondary | ICD-10-CM | POA: Diagnosis present

## 2017-02-05 DIAGNOSIS — E222 Syndrome of inappropriate secretion of antidiuretic hormone: Secondary | ICD-10-CM | POA: Diagnosis present

## 2017-02-05 DIAGNOSIS — E872 Acidosis: Secondary | ICD-10-CM | POA: Diagnosis present

## 2017-02-05 DIAGNOSIS — M1990 Unspecified osteoarthritis, unspecified site: Secondary | ICD-10-CM | POA: Diagnosis present

## 2017-02-05 DIAGNOSIS — Z809 Family history of malignant neoplasm, unspecified: Secondary | ICD-10-CM

## 2017-02-05 LAB — URINALYSIS, COMPLETE (UACMP) WITH MICROSCOPIC
BILIRUBIN URINE: NEGATIVE
GLUCOSE, UA: NEGATIVE mg/dL
Hgb urine dipstick: NEGATIVE
KETONES UR: NEGATIVE mg/dL
Leukocytes, UA: NEGATIVE
NITRITE: NEGATIVE
Protein, ur: NEGATIVE mg/dL
Specific Gravity, Urine: 1.025 (ref 1.005–1.030)
pH: 5 (ref 5.0–8.0)

## 2017-02-05 LAB — LACTIC ACID, PLASMA: LACTIC ACID, VENOUS: 2.1 mmol/L — AB (ref 0.5–1.9)

## 2017-02-05 MED ORDER — ACETAMINOPHEN 650 MG RE SUPP: 650.0000 mg | Freq: Four times a day (QID) | RECTAL | Status: DC | PRN

## 2017-02-05 MED ORDER — ONDANSETRON HCL 4 MG/2ML IJ SOLN: 4.0000 mg | Freq: Four times a day (QID) | INTRAMUSCULAR | Status: DC | PRN

## 2017-02-05 MED ORDER — LIDOCAINE 5 % EX PTCH
2.0000 | MEDICATED_PATCH | CUTANEOUS | Status: DC
  Administered 2017-02-05 – 2017-02-08 (×4): 2 via TRANSDERMAL
  Filled 2017-02-05 (×5): qty 2

## 2017-02-05 MED ORDER — POLYETHYLENE GLYCOL 3350 17 G PO PACK
17.0000 g | PACK | Freq: Every day | ORAL | Status: DC
  Administered 2017-02-05: 17 g via ORAL

## 2017-02-05 MED ORDER — MEMANTINE HCL-DONEPEZIL HCL ER 28-10 MG PO CP24: 1.0000 | ORAL_CAPSULE | Freq: Every day | ORAL | Status: DC

## 2017-02-05 MED ORDER — SODIUM CHLORIDE 0.9 % IV BOLUS (SEPSIS)
920.0000 mL | Freq: Once | INTRAVENOUS | Status: AC
  Administered 2017-02-05: 920 mL via INTRAVENOUS

## 2017-02-05 MED ORDER — IPRATROPIUM BROMIDE 0.02 % IN SOLN: 0.5000 mg | Freq: Four times a day (QID) | RESPIRATORY_TRACT | Status: DC | PRN

## 2017-02-05 MED ORDER — MAGNESIUM CITRATE PO SOLN
1.0000 | Freq: Once | ORAL | Status: DC | PRN
  Filled 2017-02-05: qty 296

## 2017-02-05 MED ORDER — SENNOSIDES-DOCUSATE SODIUM 8.6-50 MG PO TABS: 1.0000 | ORAL_TABLET | Freq: Every evening | ORAL | Status: DC | PRN

## 2017-02-05 MED ORDER — ALBUTEROL SULFATE (2.5 MG/3ML) 0.083% IN NEBU: 2.5000 mg | INHALATION_SOLUTION | Freq: Four times a day (QID) | RESPIRATORY_TRACT | Status: DC | PRN

## 2017-02-05 MED ORDER — BISACODYL 5 MG PO TBEC: 5.0000 mg | DELAYED_RELEASE_TABLET | Freq: Every day | ORAL | Status: DC | PRN

## 2017-02-05 MED ORDER — HEPARIN SODIUM (PORCINE) 5000 UNIT/ML IJ SOLN
5000.0000 [IU] | Freq: Three times a day (TID) | INTRAMUSCULAR | Status: DC
  Administered 2017-02-05 – 2017-02-06 (×4): 5000 [IU] via SUBCUTANEOUS
  Filled 2017-02-05 (×5): qty 1

## 2017-02-05 MED ORDER — MIRTAZAPINE 15 MG PO TABS
15.0000 mg | ORAL_TABLET | Freq: Every day | ORAL | Status: DC
  Administered 2017-02-05: 22:00:00 15 mg via ORAL
  Filled 2017-02-05: qty 1

## 2017-02-05 MED ORDER — MEMANTINE HCL ER 14 MG PO CP24
28.0000 mg | ORAL_CAPSULE | Freq: Every day | ORAL | Status: DC
  Administered 2017-02-05: 11:00:00 28 mg via ORAL
  Filled 2017-02-05: qty 2

## 2017-02-05 MED ORDER — ENSURE ENLIVE PO LIQD
237.0000 mL | Freq: Three times a day (TID) | ORAL | Status: DC
  Administered 2017-02-05 – 2017-02-08 (×5): 237 mL via ORAL

## 2017-02-05 MED ORDER — POLYETHYLENE GLYCOL 3350 17 G PO PACK
17.0000 g | PACK | Freq: Every day | ORAL | Status: DC
  Administered 2017-02-05: 10:00:00 17 g via ORAL
  Filled 2017-02-05: qty 1

## 2017-02-05 MED ORDER — DONEPEZIL HCL 5 MG PO TABS
10.0000 mg | ORAL_TABLET | Freq: Every day | ORAL | Status: DC
  Administered 2017-02-05: 10 mg via ORAL
  Filled 2017-02-05: qty 2

## 2017-02-05 MED ORDER — ACETAMINOPHEN 325 MG PO TABS: 650.0000 mg | ORAL_TABLET | Freq: Four times a day (QID) | ORAL | Status: DC | PRN

## 2017-02-05 MED ORDER — ASPIRIN EC 81 MG PO TBEC
81.0000 mg | DELAYED_RELEASE_TABLET | Freq: Every day | ORAL | Status: DC
  Administered 2017-02-05: 11:00:00 81 mg via ORAL
  Filled 2017-02-05: qty 1

## 2017-02-05 MED ORDER — METOPROLOL TARTRATE 25 MG PO TABS
25.0000 mg | ORAL_TABLET | Freq: Two times a day (BID) | ORAL | Status: DC
  Administered 2017-02-05 (×2): 25 mg via ORAL
  Filled 2017-02-05 (×2): qty 1

## 2017-02-05 MED ORDER — ONDANSETRON HCL 4 MG PO TABS: 4.0000 mg | ORAL_TABLET | Freq: Four times a day (QID) | ORAL | Status: DC | PRN

## 2017-02-05 MED ORDER — DEXTROSE 5 % IV SOLN: 1.0000 g | Freq: Once | INTRAVENOUS | Status: DC

## 2017-02-05 MED ORDER — SODIUM CHLORIDE 0.9 % IV SOLN
INTRAVENOUS | Status: DC
  Administered 2017-02-05 – 2017-02-06 (×3): via INTRAVENOUS

## 2017-02-05 MED ORDER — CEFTRIAXONE SODIUM-DEXTROSE 1-3.74 GM-% IV SOLR
1.0000 g | INTRAVENOUS | Status: DC
  Administered 2017-02-05: 1 g via INTRAVENOUS
  Filled 2017-02-05 (×3): qty 50

## 2017-02-05 MED ORDER — DUTASTERIDE 0.5 MG PO CAPS
0.5000 mg | ORAL_CAPSULE | Freq: Every day | ORAL | Status: DC
  Administered 2017-02-05: 0.5 mg via ORAL
  Filled 2017-02-05: qty 1

## 2017-02-05 MED ORDER — OXYCODONE HCL 5 MG PO TABS
5.0000 mg | ORAL_TABLET | ORAL | Status: DC | PRN
  Administered 2017-02-07: 5 mg via ORAL
  Filled 2017-02-05: qty 1

## 2017-02-05 MED ORDER — SENNOSIDES-DOCUSATE SODIUM 8.6-50 MG PO TABS
1.0000 | ORAL_TABLET | Freq: Two times a day (BID) | ORAL | Status: DC
  Administered 2017-02-05 (×2): 1 via ORAL
  Filled 2017-02-05 (×2): qty 1

## 2017-02-05 NOTE — Consult Note (Signed)
[facility] NOTE  Patient Care Team: Marden Noble, MD as PCP - General (Internal Medicine)  CHIEF COMPLAINTS/PURPOSE OF CONSULTATION: Lung cancer/failure to thrive  HISTORY OF PRESENTING ILLNESS: Please note patient is a poor historian/dementia; multiple family members but the bedside. Wayne Le 83 y.o.  male multiple medical problems including dementia who has had recent workup for suspected lung cancer with metastasis to the liver [follows up with Dr. Mike Gip- currently admitted to the hospital for mental status changes/ failure to thrive.  As per the family patient was recently admitted to the hospital for small bowel obstruction; treated conservatively; patient had a liver biopsy done Approximately 3 days ago. As per the family patient seems more confused; generalized weakness.   Course in the hospital- CT of the brain noncontrast negative for any active bleeding. Chest x-ray continues to show right lower lobe mass; without any new changes. Blood cultures are pending. No fevers. Not on antibiotics.   ROS: Unable to assess given patient's mental status/dementia.  MEDICAL HISTORY:  Past Medical History:  Diagnosis Date  . Arthritis   . Cancer ([place])   . Cataract   . Dysrhythmia    Atrial fib  . Hypertension   . Memory changes   . Prostate disorder     SURGICAL HISTORY: Past Surgical History:  Procedure Laterality Date  . HERNIA REPAIR      SOCIAL HISTORY: Social History   Social History  . Marital status: Widowed    Spouse name: N/A  . Number of children: N/A  . Years of education: N/A   Occupational History  . Not on file.   Social History Main Topics  . Smoking status: Current Every Day Smoker    Packs/day: 0.25  . Smokeless tobacco: Never Used     Comment: Pack lasts one week  . Alcohol use No  . Drug use: No  . Sexual activity: Not on file   Other Topics Concern  . Not on file   Social History Narrative  . No narrative  on file    FAMILY HISTORY: Family History  Problem Relation Age of Onset  . Cancer Brother     ALLERGIES:  is allergic to iodine; other; and penicillins.  MEDICATIONS:  Current Facility-Administered Medications  Medication Dose Route Frequency Provider Last Rate Last Dose  . 0.9 %  sodium chloride infusion   Intravenous Continuous Alexis Hugelmeyer, DO 75 mL/hr at 02/05/17 0246    . acetaminophen (TYLENOL) tablet 650 mg  650 mg Oral Q6H PRN Alexis Hugelmeyer, DO       Or  . acetaminophen (TYLENOL) suppository 650 mg  650 mg Rectal Q6H PRN Alexis Hugelmeyer, DO      . albuterol (PROVENTIL) (2.5 MG/3ML) 0.083% nebulizer solution 2.5 mg  2.5 mg Nebulization Q6H PRN Alexis Hugelmeyer, DO      . aspirin EC tablet 81 mg  81 mg Oral Daily Alexis Hugelmeyer, DO   81 mg at 02/05/17 1051  . memantine (NAMENDA XR) 24 hr capsule 28 mg  28 mg Oral Daily Alexis Hugelmeyer, DO   28 mg at 02/05/17 1051   And  . donepezil (ARICEPT) tablet 10 mg  10 mg Oral Daily Alexis Hugelmeyer, DO   10 mg at 02/05/17 1051  . dutasteride (AVODART) capsule 0.5 mg  0.5 mg Oral Daily Alexis Hugelmeyer, DO   0.5 mg at 02/05/17 1051  . feeding supplement (ENSURE ENLIVE) (ENSURE ENLIVE) liquid 237 mL  237 mL Oral TID  BM Theodoro Grist, MD   237 mL at 02/05/17 1000  . heparin injection 5,000 Units  5,000 Units Subcutaneous Q8H Alexis Hugelmeyer, DO   5,000 Units at 02/05/17 0555  . ipratropium (ATROVENT) nebulizer solution 0.5 mg  0.5 mg Nebulization Q6H PRN Alexis Hugelmeyer, DO      . lidocaine (LIDODERM) 5 % 2 patch  2 patch Transdermal Q24H Theodoro Grist, MD   2 patch at 02/05/17 0919  . metoprolol tartrate (LOPRESSOR) tablet 25 mg  25 mg Oral BID Alexis Hugelmeyer, DO   25 mg at 02/05/17 1052  . mirtazapine (REMERON) tablet 15 mg  15 mg Oral QHS Alexis Hugelmeyer, DO      . ondansetron (ZOFRAN) tablet 4 mg  4 mg Oral Q6H PRN Alexis Hugelmeyer, DO       Or  . ondansetron (ZOFRAN) injection 4 mg  4 mg Intravenous Q6H PRN  Alexis Hugelmeyer, DO      . oxyCODONE (Oxy IR/ROXICODONE) immediate release tablet 5 mg  5 mg Oral Q4H PRN Alexis Hugelmeyer, DO      . polyethylene glycol (MIRALAX / GLYCOLAX) packet 17 g  17 g Oral Daily Alexis Hugelmeyer, DO   17 g at 02/05/17 1000  . polyethylene glycol (MIRALAX / GLYCOLAX) packet 17 g  17 g Oral Daily Theodoro Grist, MD   17 g at 02/05/17 1051  . senna-docusate (Senokot-S) tablet 1 tablet  1 tablet Oral BID Theodoro Grist, MD   1 tablet at 02/05/17 1051      .  PHYSICAL EXAMINATION:  Vitals:   02/05/17 0407 02/05/17 1049  BP: (!) 119/57 104/71  Pulse: 78 88  Resp: 19 18  Temp: 97.7 F (36.5 C) 97.6 F (36.4 C)   Filed Weights   02/04/17 2136 02/05/17 0212  Weight: 141 lb (64 kg) 147 lb 11.2 oz (67 kg)    GENERAL: Well-nourished well-developed; Alert; confused, no distress and comfortable.   Accompanied by multiple family members.  EYES: no pallor or icterus OROPHARYNX: no thrush or ulceration. NECK: supple, no masses felt LYMPH:  no palpable lymphadenopathy in the cervical, axillary or inguinal regions LUNGS: decreased breath sounds to auscultation at bases and  No wheeze or crackles HEART/CVS: regular rate & rhythm and no murmurs; No lower extremity edema ABDOMEN: abdomen soft, non-tender and normal bowel sounds Musculoskeletal:no cyanosis of digits and no clubbing  PSYCH: alert & oriented x 0 NEURO: no focal motor/sensory deficits SKIN:  no rashes or significant lesions  LABORATORY DATA:  I have reviewed the data as listed Lab Results  Component Value Date   WBC 7.1 02/04/2017   HGB 15.1 02/04/2017   HCT 46.5 02/04/2017   MCV 74.2 (L) 02/04/2017   PLT 171 02/04/2017    Recent Labs  08/24/16 1625  01/23/17 1724 01/28/17 1411  01/29/17 1340 01/30/17 0449 02/04/17 2219  NA  --   < > 136 136  < > 139 136 134*  K  --   < > 4.8 5.1  < > 4.4 4.2 4.7  CL  --   < > 107 106  < > 108 107 102  CO2  --   < > 23 22  < > '24 23 22  '$ GLUCOSE  --   <  > 108* 99  < > 143* 94 222*  BUN  --   < > 22* 22*  < > 28* 22* 28*  CREATININE  --   < > 1.32* 1.18  < > 1.30*  1.09 1.40*  CALCIUM  --   < > 9.4 9.4  < > 8.7* 8.5* 9.8  GFRNONAA  --   < > 47* 54*  < > 48* 59* 44*  GFRAA  --   < > 54* >60  < > 55* >60 51*  PROT  --   < > 8.2* 7.7  --   --   --  7.9  ALBUMIN  --   < > 3.2* 3.0*  --   --   --  2.9*  AST  --   < > 111* 107*  --   --   --  185*  ALT  --   < > 79* 70*  --   --   --  105*  ALKPHOS  --   < > 189* 152*  --   --   --  230*  BILITOT  --   < > 0.8 1.2  --   --   --  1.2  BILIDIR 0.4  --   --   --   --   --   --   --   < > = values in this interval not displayed.  RADIOGRAPHIC STUDIES: I have personally reviewed the radiological images as listed and agreed with the findings in the report. Dg Chest 2 View  Result Date: 01/15/2017 CLINICAL DATA:  Follow-up infiltrate EXAM: CHEST  2 VIEW COMPARISON:  Chest x-ray 12/12/2016, 08/24/2016, chest CT 04/07/2014 FINDINGS: Heart size within normal limits. Atherosclerotic aorta. Negative for heart failure. Particular airspace density bilaterally similar to prior studies and most consistent with scarring. Negative for pneumonia or edema. No pleural effusion. Pleural scarring in the right lung base unchanged. Left rib deformity, question prior thoracotomy or fracture. IMPRESSION: Chronic lung disease with fibrosis. No superimposed acute abnormality. Electronically Signed   By: Franchot Gallo M.D.   On: 01/15/2017 17:24   Ct Head Wo Contrast  Result Date: 02/05/2017 CLINICAL DATA:  Altered mental status EXAM: CT HEAD WITHOUT CONTRAST TECHNIQUE: Contiguous axial images were obtained from the base of the skull through the vertex without intravenous contrast. COMPARISON:  Head CT 01/28/2017 FINDINGS: Brain: No mass lesion, intraparenchymal hemorrhage or extra-axial collection. No evidence of acute cortical infarct. Unchanged moderate atrophy. There is periventricular hypoattenuation compatible with chronic  microvascular disease. 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 Vascular: Atherosclerotic calcification of the vertebral and internal carotid arteries at the skull base. Skull: Normal visualized skull base, calvarium and extracranial soft tissues. Sinuses/Orbits: No sinus fluid levels or advanced mucosal thickening. No mastoid effusion. Normal orbits. IMPRESSION: 1. No acute intracranial abnormality. 2. Moderate atrophy and chronic microvascular ischemia. Electronically Signed   By: Ulyses Jarred M.D.   On: 02/05/2017 01:37   Ct Head Wo Contrast  Result Date: 01/28/2017 CLINICAL DATA:  83 year old male with altered mental status. EXAM: CT HEAD WITHOUT CONTRAST TECHNIQUE: Contiguous axial images were obtained from the base of the skull through the vertex without intravenous contrast. COMPARISON:  08/24/2016 head CT FINDINGS: Brain: No evidence of acute infarction, hemorrhage, hydrocephalus, extra-axial collection or mass lesion/mass effect. Atrophy and chronic small-vessel white matter ischemic changes again noted. Vascular: Intracranial atherosclerotic calcifications noted. Skull: No acute abnormality. Sinuses/Orbits: No acute finding. Other: None. IMPRESSION: No evidence of acute intracranial abnormality. Atrophy and chronic small-vessel white matter ischemic changes. Electronically Signed   By: Margarette Canada M.D.   On: 01/28/2017 19:12   Ct Chest W Contrast  Result Date: 01/15/2017 CLINICAL DATA:  Generalized abdominal  pain with constipation 1 month. Cough. EXAM: CT CHEST, ABDOMEN, AND PELVIS WITH CONTRAST TECHNIQUE: Multidetector CT imaging of the chest, abdomen and pelvis was performed following the standard protocol during bolus administration of intravenous contrast. CONTRAST:  161m ISOVUE-300 IOPAMIDOL (ISOVUE-300) INJECTION 61% COMPARISON:  Chest CT 04/07/2014 and abdominopelvic CT 05/16/2013 FINDINGS: CT CHEST FINDINGS Cardiovascular: There is mild she megaly with calcified plaque over  the left main and 3 vessel coronary arteries. Moderate calcified plaque over the thoracic aorta. Ascending thoracic aorta measures 3.7 cm in AP diameter. Remaining vascular structures unremarkable. Mediastinum/Nodes: High pretracheal lymph node measuring 2.2 cm by short axis. There is bilateral hilar adenopathy. 1 cm precarinal lymph node. Subcentimeter right pericardial phrenic lymph node. Lungs/Pleura: Lungs are adequately inflated demonstrate diffuse emphysematous disease. There is evidence of patient's known patchy bilateral fibrosis without significant change. There is new focal airspace consolidation over the right lower lobe which is somewhat masslike with scattered bilateral nodular airspace process. No significant effusion. Aspirate material within the right side of the trachea. There is also material within the right lower lobe bronchus with complete opacification of the right lower lobe bronchus. These findings may related multifocal pneumonia due to aspiration, however neoplasm with metastatic disease is not excluded. Musculoskeletal: Degenerative change of the spine. CT ABDOMEN PELVIS FINDINGS Hepatobiliary: Multiple ill-defined low-density liver masses with the largest over the dome of the right lobe measuring 2.6 cm likely metastatic disease. Gallbladder and biliary tree are within normal. Pancreas: Within normal. Spleen: Within normal. Adrenals/Urinary Tract: Adrenal glands are normal. Kidneys are normal in size and demonstrate several right renal cysts with the largest over the lower pole measuring 7.1 cm. The bladder and ureters are unremarkable. Stomach/Bowel: Stomach and small bowel are unremarkable. Appendix is normal. Moderate fecal retention over the rectosigmoid colon as the colon is otherwise unremarkable. Vascular/Lymphatic: Moderate calcified plaque over the abdominal aorta and iliac arteries. Reproductive: Within normal. Other: No free fluid or focal inflammatory change. Musculoskeletal:  Degenerative changes of the spine. Degenerative change of the hips. Findings suggesting avascular necrosis of the left femoral head with suggestion of osteoarthritic change in avascular necrosis of the right femoral head. IMPRESSION: Masslike consolidation the right lower lobe with scattered bilateral discrete nodular opacities. Bilateral hilar and mediastinal adenopathy. Aspirate material within the trachea and right lower lobe bronchi causing right lower lobe bronchial obstruction. Findings likely due to right lower lobe primary lung cancer with metastatic disease. Cannot exclude superimposed infection. Recommend tissue sampling for further evaluation. Multiple low-density liver lesions compatible with metastatic disease. Emphysematous disease with scattered fibrotic changes. Mild cardiomegaly. Left main and 3 vessel atherosclerotic coronary artery disease. Aortic atherosclerosis. Mild ectasia of the ascending thoracic aorta measuring 3.7 cm. Recommend annual imaging followup by CTA or MRA. This recommendation follows 2010 ACCF/AHA/AATS/ACR/ASA/SCA/SCAI/SIR/STS/SVM Guidelines for the Diagnosis and Management of Patients with Thoracic Aortic Disease. Circulation.2010; 121:: N361-W431 Right renal cysts with the largest measuring 7.1 cm over the lower pole. 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 Electronically Signed   By: DMarin OlpM.D.   On: 01/15/2017 20:25   Mr Brain Wo Contrast  Result Date: 01/25/2017 CLINICAL DATA:  83y/o  M; lung mass. EXAM: MRI HEAD WITHOUT CONTRAST TECHNIQUE: Multiplanar, multiecho pulse sequences of the brain and surrounding structures were obtained without intravenous contrast. COMPARISON:  08/24/2016 CT head FINDINGS: Brain: No acute infarction, hemorrhage, hydrocephalus, extra-axial collection or mass lesion identified. T2 FLAIR hyperintense foci in subcortical and periventricular white  matter are  nonspecific but compatible with moderate chronic microvascular ischemic changes. Moderate brain parenchymal volume loss. Stable T2 hyperintense foci in the right cerebellar hemisphere probably represent chronic lacunar infarcts. Vascular: Normal flow voids. Skull and upper cervical spine: Normal marrow signal. Sinuses/Orbits: Negative. Other: Bilateral intra-ocular lens replacement. IMPRESSION: 1. No acute intracranial abnormality. 2. No focal mass effect identified. Suboptimal evaluation for small metastatic disease in the absence of intravenous contrast. 3. Stable moderate chronic microvascular ischemic changes and brain parenchymal volume loss. Electronically Signed   By: Kristine Garbe M.D.   On: 01/25/2017 13:38   Ct Abdomen Pelvis W Contrast  Result Date: 01/15/2017 CLINICAL DATA:  Generalized abdominal pain with constipation 1 month. Cough. EXAM: CT CHEST, ABDOMEN, AND PELVIS WITH CONTRAST TECHNIQUE: Multidetector CT imaging of the chest, abdomen and pelvis was performed following the standard protocol during bolus administration of intravenous contrast. CONTRAST:  135m ISOVUE-300 IOPAMIDOL (ISOVUE-300) INJECTION 61% COMPARISON:  Chest CT 04/07/2014 and abdominopelvic CT 05/16/2013 FINDINGS: CT CHEST FINDINGS Cardiovascular: There is mild she megaly with calcified plaque over the left main and 3 vessel coronary arteries. Moderate calcified plaque over the thoracic aorta. Ascending thoracic aorta measures 3.7 cm in AP diameter. Remaining vascular structures unremarkable. Mediastinum/Nodes: High pretracheal lymph node measuring 2.2 cm by short axis. There is bilateral hilar adenopathy. 1 cm precarinal lymph node. Subcentimeter right pericardial phrenic lymph node. Lungs/Pleura: Lungs are adequately inflated demonstrate diffuse emphysematous disease. There is evidence of patient's known patchy bilateral fibrosis without significant change. There is new focal airspace consolidation over the right  lower lobe which is somewhat masslike with scattered bilateral nodular airspace process. No significant effusion. Aspirate material within the right side of the trachea. There is also material within the right lower lobe bronchus with complete opacification of the right lower lobe bronchus. These findings may related multifocal pneumonia due to aspiration, however neoplasm with metastatic disease is not excluded. Musculoskeletal: Degenerative change of the spine. CT ABDOMEN PELVIS FINDINGS Hepatobiliary: Multiple ill-defined low-density liver masses with the largest over the dome of the right lobe measuring 2.6 cm likely metastatic disease. Gallbladder and biliary tree are within normal. Pancreas: Within normal. Spleen: Within normal. Adrenals/Urinary Tract: Adrenal glands are normal. Kidneys are normal in size and demonstrate several right renal cysts with the largest over the lower pole measuring 7.1 cm. The bladder and ureters are unremarkable. Stomach/Bowel: Stomach and small bowel are unremarkable. Appendix is normal. Moderate fecal retention over the rectosigmoid colon as the colon is otherwise unremarkable. Vascular/Lymphatic: Moderate calcified plaque over the abdominal aorta and iliac arteries. Reproductive: Within normal. Other: No free fluid or focal inflammatory change. Musculoskeletal: Degenerative changes of the spine. Degenerative change of the hips. Findings suggesting avascular necrosis of the left femoral head with suggestion of osteoarthritic change in avascular necrosis of the right femoral head. IMPRESSION: Masslike consolidation the right lower lobe with scattered bilateral discrete nodular opacities. Bilateral hilar and mediastinal adenopathy. Aspirate material within the trachea and right lower lobe bronchi causing right lower lobe bronchial obstruction. Findings likely due to right lower lobe primary lung cancer with metastatic disease. Cannot exclude superimposed infection. Recommend tissue  sampling for further evaluation. Multiple low-density liver lesions compatible with metastatic disease. Emphysematous disease with scattered fibrotic changes. Mild cardiomegaly. Left main and 3 vessel atherosclerotic coronary artery disease. Aortic atherosclerosis. Mild ectasia of the ascending thoracic aorta measuring 3.7 cm. Recommend annual imaging followup by CTA or MRA. This recommendation follows 2010 ACCF/AHA/AATS/ACR/ASA/SCA/SCAI/SIR/STS/SVM Guidelines for the Diagnosis and Management of  Patients with Thoracic Aortic Disease. Circulation.2010; 121: W431-V400. Right renal cysts with the largest measuring 7.1 cm over the lower pole. 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 Electronically Signed   By: Marin Olp M.D.   On: 01/15/2017 20:25   Nm Pet Image Initial (pi) Skull Base To Thigh  Result Date: 01/25/2017 CLINICAL DATA:  Initial treatment strategy for right lower lobe lung mass with suspected pulmonary and hepatic metastatic disease. EXAM: NUCLEAR MEDICINE PET SKULL BASE TO THIGH TECHNIQUE: 13.1 mCi F-18 FDG was injected intravenously. Full-ring PET imaging was performed from the skull base to thigh after the radiotracer. CT data was obtained and used for attenuation correction and anatomic localization. FASTING BLOOD GLUCOSE:  Value: 84 mg/dl COMPARISON:  CTs 01/15/2017. FINDINGS: NECK Questionable small hypermetabolic supraclavicular node on the right (SUV max 4.1). No corresponding enlarged node is seen on the CT images. There are no other hypermetabolic or enlarged cervical lymph nodes.There are no lesions of the pharyngeal mucosal space. Intracranial atrophy and chronic atherosclerosis noted. CHEST There are multiple hypermetabolic mediastinal and right hilar lymph nodes. 2.3 cm high right paratracheal node on image 78 has an SUV max of 7.8. Right hilar mass has an SUV max of 8.9. There are hypermetabolic pulmonary  nodules bilaterally. Dominant right lower lobe mass measures approximately 6.3 x 5.7 cm on image 107 and demonstrates an SUV max of 9.6. Left lower lobe infrahilar nodule measuring approximately 2.3 cm on image 867 is hypermetabolic with an SUV max of 5.8. There is underlying chronic lung disease with emphysema and peripheral subpleural reticulation. There is extensive atherosclerosis of the aorta, great vessels and coronary arteries. ABDOMEN/PELVIS There is widespread, confluent hypermetabolic activity throughout the liver consistent with extensive metastatic disease. In the right lobe, this has an SUV max of 9.7. There is no abnormal metabolic activity within the adrenal glands, spleen or pancreas. However, several hypermetabolic lymph nodes are present in the porta hepatis, measuring up to 1.9 cm on image 148 (SUV max 9.3). Large right renal cyst and diffuse aortoiliac atherosclerosis noted. SKELETON Small lytic lesion in the left inferior pubic ramus (image 619) is hypermetabolic (SUV max 4.6). There is also focally increased activity near the right seventh costovertebral junction (SUV max 4.5) which could reflect a small metastasis. No other worrisome osseous activity. IMPRESSION: 1. Dominant right lower lobe lung mass is hypermetabolic, and there are multiple hypermetabolic pulmonary nodules, mediastinal, right hilar and porta hepatis lymph nodes and hepatic lesions consistent with stage IV lung cancer. 2. Possible small osseous metastases within the left inferior pubic ramus and near the right seventh costovertebral junction. 3. Diffuse atherosclerosis. Electronically Signed   By: Richardean Sale M.D.   On: 01/25/2017 11:42   US Biopsy  Result Date: 02/02/2017 CLINICAL DATA:  Right lower lobe lung mass and diffuse metastatic lesions throughout the liver. The patient presents for liver biopsy to establish tissue diagnosis. EXAM: ULTRASOUND GUIDED CORE BIOPSY OF LIVER MEDICATIONS: 25 mcg IV Fentanyl The  patient was not formally sedated as he was already sleeping during the procedure and did not require formal moderate sedation. PROCEDURE: The procedure, risks, benefits, and alternatives were explained to the patient. Questions regarding the procedure were encouraged and answered. The patient understands and consents to the procedure. A time out was performed prior to initiating the procedure. The right abdominal wall was prepped with chlorhexidine in a sterile fashion, and a sterile drape was applied covering the operative field. A  sterile gown and sterile gloves were used for the procedure. Local anesthesia was provided with 1% Lidocaine. Ultrasound was used to localize liver lesions. Under direct ultrasound guidance, a 17 gauge needle was advanced into the right lobe of the liver. A total of 3 coaxial 18 gauge core biopsy samples were obtained through liver parenchyma. Core biopsy samples were submitted in formalin. As the outer needle was retracted, several Gel-Foam pledgets were advanced into the liver parenchyma. Additional ultrasound was performed after the outer needle removal. COMPLICATIONS: None. FINDINGS: The liver parenchyma is almost entirely replaced by tumor. Core biopsy was performed within the right lobe towards the inferior aspect of the right lobe through confluent tumor. IMPRESSION: Ultrasound-guided core biopsy performed of tumor within the right lobe of the liver. Electronically Signed   By: Aletta Edouard M.D.   On: 02/02/2017 09:37   Dg Abd 2 Views  Result Date: 01/15/2017 CLINICAL DATA:  Constipation EXAM: ABDOMEN - 2 VIEW COMPARISON:  CT abdomen pelvis 05/16/2013 FINDINGS: Mild amount of stool in the colon and rectum. Negative for bowel obstruction. No free air. Atherosclerotic disease. Lumbar scoliosis and mild disc degeneration. AVN of the femoral head bilaterally. IMPRESSION: Mild amount of stool in the colon and rectum. 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 Electronically Signed   By: Franchot Gallo M.D.   On: 01/15/2017 17:22   Dg Abd Acute W/chest  Result Date: 02/04/2017 CLINICAL DATA:  Recently discharged with small bowel obstruction this past week. Patient has not been eating since the discharge. Increased weakness. Difficulty walking. Incontinence of urine and stool. One year progressive mental status change. EXAM: DG ABDOMEN ACUTE W/ 1V CHEST COMPARISON:  CT abdomen and pelvis 01/28/2017. Abdominal series 01/28/17. FINDINGS: Elevation of the right hemidiaphragm with atelectasis in the right lung base. Patchy areas of fibrosis or scarring in the right mid lung and left lower lung. Known right lower lobe mass is demonstrated behind the heart shadow at the costophrenic angle. The pulmonary nodules seen in the lower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

## 2017-02-05 NOTE — Progress Notes (Signed)
PT Cancellation Note  Patient Details Name: Wayne Le MRN: [id number] DOB: [birth year]   Cancelled Treatment:    Reason Eval/Treat Not Completed: Medical issues which prohibited therapy. Critical labs, will check later as time and pt allow.   Ramond Dial 02/05/2017, 1:30 PM  Mee Hives, PT MS Acute Rehab Dept. Number: [place] [facility] [place]

## 2017-02-05 NOTE — Progress Notes (Signed)
Chaplain received a page to visit with pt in room 102. Provided information/completed an Advanced Directive.    02/05/17 1347  Clinical Encounter Type  Visited With Patient;Patient and family together  Visit Type Initial  Referral From Nurse  Consult/Referral To Chaplain  Spiritual Encounters  Spiritual Needs Other (Comment)

## 2017-02-05 NOTE — Progress Notes (Signed)
[place] [facility] [place] NAME: Wayne Le    MR#:  [id number]  DATE OF BIRTH:  04-25-1927  SUBJECTIVE:  CHIEF COMPLAINT:   Chief Complaint  Patient presents with  . Altered Mental Status   The patient is a 83 year old male with medical history significant for history of right lower lobe mass, concerning for lung cancer, metastasis to liver and bone, status post liver mass biopsy 02/02/2017, pathology results are pending, who presents to the hospital with complaints of upper abdominal pain, constipation, failure to thrive, nausea, weight loss. His labs revealed dehydration with creatinine level of 1.4, elevated liver enzymes, lactic acid level was also elevated. X-ray of abdomen and chest was unremarkable. She was admitted to the hospital for further evaluation and treatment. Urinalysis was unremarkable. He was seen by oncologist, who recommended to follow-up with primary oncologist and palliative care evaluation. Review of Systems  Constitutional: Positive for weight loss. Negative for chills and fever.  HENT: Negative for congestion.   Eyes: Negative for blurred vision and double vision.  Respiratory: Negative for cough, sputum production, shortness of breath and wheezing.   Cardiovascular: Negative for chest pain, palpitations, orthopnea, leg swelling and PND.  Gastrointestinal: Positive for abdominal pain and nausea. Negative for blood in stool, constipation, diarrhea and vomiting.  Genitourinary: Negative for dysuria, frequency, hematuria and urgency.  Musculoskeletal: Negative for falls.  Neurological: Negative for dizziness, tremors, focal weakness and headaches.  Endo/Heme/Allergies: Does not bruise/bleed easily.  Psychiatric/Behavioral: Negative for depression. The patient does not have insomnia.     VITAL SIGNS: Blood pressure 104/71, pulse 88, temperature 97.6 F (36.4 C), temperature source Oral, resp. rate 18, height 5'  11" (1.803 m), weight 67 kg (147 lb 11.2 oz), SpO2 96 %.  PHYSICAL EXAMINATION:   GENERAL:  83 y.o.-year-old patient lying in the bed with no acute distress.  EYES: Pupils equal, round, reactive to light and accommodation. No scleral icterus. Extraocular muscles intact.  HEENT: Head atraumatic, normocephalic. Oropharynx and nasopharynx clear.  NECK:  Supple, no jugular venous distention. No thyroid enlargement, no tenderness.  LUNGS: Normal breath sounds bilaterally, no wheezing, rales,rhonchi or crepitation. No use of accessory muscles of respiration.  CARDIOVASCULAR: S1, S2 normal. No murmurs, rubs, or gallops.  ABDOMEN: Soft, mild discomfort in upper abdomen, enlarged Liver with painful edge, nondistended. Bowel sounds present. No organomegaly or mass.  EXTREMITIES: No pedal edema, cyanosis, or clubbing.  NEUROLOGIC: Cranial nerves II through XII are intact. Muscle strength 5/5 in all extremities. Sensation intact. Gait not checked.  PSYCHIATRIC: The patient is alert and oriented x 2, confused, is looking for car keys to drive to some physician's office, he is not sure who.  SKIN: No obvious rash, lesion, or ulcer.   ORDERS/RESULTS REVIEWED:   CBC  Recent Labs Lab 02/02/17 0713 02/04/17 2219  WBC 5.5 7.1  HGB 14.3 15.1  HCT 44.3 46.5  PLT 154 171  MCV 75.0* 74.2*  MCH 24.2* 24.1*  MCHC 32.3 32.5  RDW 15.4* 15.2*   ------------------------------------------------------------------------------------------------------------------  Chemistries   Recent Labs Lab 01/29/17 1340 01/30/17 0449 02/04/17 2219  NA 139 136 134*  K 4.4 4.2 4.7  CL 108 107 102  CO2 '24 23 22  '$ GLUCOSE 143* 94 222*  BUN 28* 22* 28*  CREATININE 1.30* 1.09 1.40*  CALCIUM 8.7* 8.5* 9.8  AST  --   --  185*  ALT  --   --  105*  ALKPHOS  --   --  230*  BILITOT  --   --  1.2   ------------------------------------------------------------------------------------------------------------------ estimated  creatinine clearance is 35.2 mL/min (A) (by C-G formula based on SCr of 1.4 mg/dL (H)). ------------------------------------------------------------------------------------------------------------------  Recent Labs  02/04/17 2219  TSH 16.350*    Cardiac Enzymes  Recent Labs Lab 01/30/17 1452 02/04/17 2219  TROPONINI <0.03 <0.03   ------------------------------------------------------------------------------------------------------------------ Invalid input(s): POCBNP ---------------------------------------------------------------------------------------------------------------  RADIOLOGY: Ct Head Wo Contrast  Result Date: 02/05/2017 CLINICAL DATA:  Altered mental status EXAM: CT HEAD WITHOUT CONTRAST TECHNIQUE: Contiguous axial images were obtained from the base of the skull through the vertex without intravenous contrast. COMPARISON:  Head CT 01/28/2017 FINDINGS: Brain: No mass lesion, intraparenchymal hemorrhage or extra-axial collection. No evidence of acute cortical infarct. Unchanged moderate atrophy. There is periventricular hypoattenuation compatible with chronic microvascular disease. 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 Vascular: Atherosclerotic calcification of the vertebral and internal carotid arteries at the skull base. Skull: Normal visualized skull base, calvarium and extracranial soft tissues. Sinuses/Orbits: No sinus fluid levels or advanced mucosal thickening. No mastoid effusion. Normal orbits. IMPRESSION: 1. No acute intracranial abnormality. 2. Moderate atrophy and chronic microvascular ischemia. Electronically Signed   By: Ulyses Jarred M.D.   On: 02/05/2017 01:37   Dg Abd Acute W/chest  Result Date: 02/04/2017 CLINICAL DATA:  Recently discharged with small bowel obstruction this past week. Patient has not been eating since the discharge. Increased weakness. Difficulty walking. Incontinence of urine and stool. One year progressive mental status  change. EXAM: DG ABDOMEN ACUTE W/ 1V CHEST COMPARISON:  CT abdomen and pelvis 01/28/2017. Abdominal series 01/28/17. FINDINGS: Elevation of the right hemidiaphragm with atelectasis in the right lung base. Patchy areas of fibrosis or scarring in the right mid lung and left lower lung. Known right lower lobe mass is demonstrated behind the heart shadow at the costophrenic angle. The pulmonary nodules seen in the lower lungs on previous CT are not as well depicted radiographically. No blunting of costophrenic angles. No pneumothorax. Calcified and tortuous aorta. Heart size is normal. Old left rib deformity. Scattered gas and stool throughout the colon. No large or small bowel distention. Changes likely due to ileus or dysmotility. No free intra- abdominal air. No abnormal air-fluid levels. Degenerative changes in the spine. Make sclerosis and lytic changes in the humeral heads bilaterally could represent avascular necrosis or bone metastasis. IMPRESSION: Unchanged appearance of the chest. Again demonstrated is a a right lower lobe mass with elevation of the right hemidiaphragm and patchy areas of fibrosis or scarring in the right mid lung and left lower lung. Nonobstructive bowel gas pattern. Lucent and sclerotic changes in the femoral heads bilaterally could indicate bone metastasis or avascular necrosis. Electronically Signed   By: Lucienne Capers M.D.   On: 02/04/2017 23:44    EKG:  Orders placed or performed during the hospital encounter of 02/04/17  . EKG 12-Lead  . EKG 12-Lead  . EKG 12-Lead    ASSESSMENT AND PLAN:  Active Problems:   Failure to thrive in adult #1 failure to thrive in adult, likely due to metastatic lung cancer, liver distention, pain, constipation. Doesn't consultation is obtained for calorie count, palliative care , oncology recommendations are pending. #2. Acute renal insufficiency, continue IV fluids, reassess creatinine in the morning #3. Hyponatremia, likely dehydration due  to poor oral intake, continue IV fluids, follow sodium level in the morning #4. Elevated transaminases, due to metastatic liver disease, status post recent biopsy, follow LFTs in the morning #5. Lactic acidosis, improving with IV  fluid administration, no obvious infection, now off antibiotic therapy, blood cultures are negative so far Management plans discussed with the patient, family and they are in agreement.   DRUG ALLERGIES:  Allergies  Allergen Reactions  . Iodine Swelling  . Other Other (See Comments)  . Penicillins Hives and Rash    CODE STATUS:     Code Status Orders        Start     Ordered   02/05/17 0211  Do not attempt resuscitation (DNR)  Continuous    Question Answer Comment  In the event of cardiac or respiratory ARREST Do not call a "code blue"   In the event of cardiac or respiratory ARREST Do not perform Intubation, CPR, defibrillation or ACLS   In the event of cardiac or respiratory ARREST Use medication by any route, position, wound care, and other measures to relive pain and suffering. May use oxygen, suction and manual treatment of airway obstruction as needed for comfort.      02/05/17 0210    Code Status History    Date Active Date Inactive Code Status Order ID Comments User Context   01/29/2017  1:00 PM 01/31/2017  2:23 PM DNR [id number]  Demetrios Loll, MD Inpatient   01/28/2017 11:29 PM 01/29/2017  1:00 PM Full Code [id number]  Lance Coon, MD Inpatient   08/24/2016  4:14 PM 08/25/2016  6:34 PM Full Code [id number]  Theodoro Grist, MD Inpatient    Advance Directive Documentation     Most Recent Value  Type of Advance Directive  Healthcare Power of Attorney  Pre-existing out of facility DNR order (yellow form or pink MOST form)  -  "MOST" Form in Place?  -      TOTAL TIME TAKING CARE OF THIS PATIENT: 40 minutes.    Theodoro Grist M.D on 02/05/2017 at 12:55 PM  Between 7am to 6pm - Pager - [phone]  After 6pm go to [email or web address] - password [email or web address]  [facility]  Office  [phone]  CC: Primary care physician; Marden Noble, MD

## 2017-02-05 NOTE — ED Notes (Signed)
Pt in a fib on monitor. Pt denies needs. Pt remains alert to self only. resps unlabored. Pt updated, told he is to be admitted to hospital.

## 2017-02-05 NOTE — ED Notes (Signed)
Patient transported to CT 

## 2017-02-05 NOTE — H&P (Signed)
History and Physical   [facility] - [place] @ [facility] Admission History and Physical [employer], D.O.    Patient Name: Wayne Le MR#: [id number] Date of Birth: Jul 20, 1927 Date of Admission: 02/04/2017  Referring MD/NP/PA: Dr. Mable Paris Primary Care Physician: Marden Noble, MD Patient coming from: Home  Chief Complaint:  Chief Complaint  Patient presents with  . Altered Mental Status   Please note the entire history is obtained from the patient's emergency department chart, emergency department provider and prior records. Patient's personal history is limited by dementia and altered mental status.  HPI: Wayne Le is a 83 y.o. male with a known history of small bowel obstruction, arthritis, lung cancer with diffuse metastases, atrial fibrillation, hypertension, dementia, CAD presents to the emergency department for evaluation of AMS.  Patient was recently discharged from the hospital following a stay for small bowel obstruction. Patient's daughter brings in to the emergency department tonight stating that not much has changed however she does not have the resources to care for him at home. She reports weakness, decreased by mouth intake.  ED Course: Patient received Rocephin, normal saline.  Review of Systems:  Unable to obtain secondary to patient's altered mental status and dementia  Past Medical History:  Diagnosis Date  . Arthritis   . Cancer ([place])   . Cataract   . Dysrhythmia    Atrial fib  . Hypertension   . Memory changes   . Prostate disorder     Past Surgical History:  Procedure Laterality Date  . HERNIA REPAIR       reports that he has been smoking.  He has been smoking about 0.25 packs per day. He has never used smokeless tobacco. He reports that he does not drink alcohol or use drugs.  Allergies  Allergen Reactions  . Iodine Swelling  . Other Other (See Comments)  . Penicillins Hives and Rash    Family History  Problem Relation  Age of Onset  . Cancer Brother   Family History   Medical History Relation Name Comments  Cancer Brother    Breast cancer Daughter    Aneurysm Son  brain  Seizures Son       Prior to Admission medications   Medication Sig Start Date End Date Taking? Authorizing Provider  aspirin EC 81 MG tablet Take 81 mg by mouth daily.   Yes Historical Provider, MD  docusate sodium (COLACE) 50 MG capsule Take 50 mg by mouth daily.   Yes Historical Provider, MD  dutasteride (AVODART) 0.5 MG capsule Take 1 capsule by mouth daily. 08/08/16  Yes Historical Provider, MD  metoprolol tartrate (LOPRESSOR) 25 MG tablet Take 1 tablet (25 mg total) by mouth 2 (two) times daily. 01/30/17  Yes Demetrios Loll, MD  mirtazapine (REMERON) 15 MG tablet Take 15 mg by mouth at bedtime.   Yes Historical Provider, MD  NAMZARIC 28-10 MG CP24 Take 1 capsule by mouth daily. 07/25/16  Yes Historical Provider, MD  oxyCODONE-acetaminophen (PERCOCET/ROXICET) 5-325 MG tablet Take 1 tablet by mouth every 6 (six) hours as needed for moderate pain. 01/31/17  Yes Demetrios Loll, MD  polyethylene glycol [facility]) packet Take 17 g by mouth daily. 01/27/17  Yes Gregor Hams, MD    Physical Exam: Vitals:   02/04/17 2330 02/04/17 2345 02/05/17 0000 02/05/17 0030  BP: 132/69  120/66 (!) 148/68  Pulse: 68 71 81 100  Resp: (!) 34 '16 19 14  '$ Temp:      TempSrc:  SpO2: 94% 94% 94% 98%  Weight:      Height:        GENERAL: 83 y.o.-year-old male patient, cachectic, ill-appearing.  Pleasant but confused.  HEENT: Head atraumatic, normocephalic. Pupils equal, round, reactive to light and accommodation. No scleral icterus.Extraocular muscles intact. Nares are patent. Oropharynx is clear. Mucus membranes dry. NECK: Supple, full range of motion. No JVD, no bruit heard. CHEST: Normal breath sounds bilaterally. No wheezing, rales, rhonchi or crackles. No use of accessory muscles of respiration.  CARDIOVASCULAR: Irregular. No murmurs, rubs, or  gallops. Cap refill <2 seconds. Pulses intact distally.  ABDOMEN: Soft, nondistended, nontender. No rebound, guarding, rigidity. Normoactive bowel sounds present in all four quadrants. No organomegaly or mass. EXTREMITIES: No pedal edema, cyanosis, or clubbing. No calf tenderness or Homan's sign.  NEUROLOGIC: The patient is awake, unable to follow commands.  PSYCHIATRIC:  Normal affect, mood, thought content. SKIN: Warm, dry, and intact without obvious rash, lesion, or ulcer.    Labs on Admission:  CBC:  Recent Labs Lab 01/29/17 0457 02/02/17 0713 02/04/17 2219  WBC 7.6 5.5 7.1  HGB 15.0 14.3 15.1  HCT 45.7 44.3 46.5  MCV 73.9* 75.0* 74.2*  PLT 147* 154 086   Basic Metabolic Panel:  Recent Labs Lab 01/29/17 0457 01/29/17 1340 01/30/17 0449 02/04/17 2219  NA 136 139 136 134*  K 5.5* 4.4 4.2 4.7  CL 105 108 107 102  CO2 '23 24 23 22  '$ GLUCOSE 110* 143* 94 222*  BUN 26* 28* 22* 28*  CREATININE 1.32* 1.30* 1.09 1.40*  CALCIUM 9.2 8.7* 8.5* 9.8   GFR: Estimated Creatinine Clearance: 33.7 mL/min (A) (by C-G formula based on SCr of 1.4 mg/dL (H)). Liver Function Tests:  Recent Labs Lab 02/04/17 2219  AST 185*  ALT 105*  ALKPHOS 230*  BILITOT 1.2  PROT 7.9  ALBUMIN 2.9*   No results for input(s): LIPASE, AMYLASE in the last 168 hours. No results for input(s): AMMONIA in the last 168 hours. Coagulation Profile:  Recent Labs Lab 02/02/17 0713  INR 1.21   Cardiac Enzymes:  Recent Labs Lab 01/30/17 1452 02/04/17 2219  TROPONINI <0.03 <0.03   BNP (last 3 results) No results for input(s): PROBNP in the last 8760 hours. HbA1C: No results for input(s): HGBA1C in the last 72 hours. CBG: No results for input(s): GLUCAP in the last 168 hours. Lipid Profile: No results for input(s): CHOL, HDL, LDLCALC, TRIG, CHOLHDL, LDLDIRECT in the last 72 hours. Thyroid Function Tests:  Recent Labs  02/04/17 2219  TSH 16.350*  FREET4 1.19*   Anemia Panel: No  results for input(s): VITAMINB12, FOLATE, FERRITIN, TIBC, IRON, RETICCTPCT in the last 72 hours. Urine analysis:    Component Value Date/Time   COLORURINE AMBER (A) 02/04/2017 2346   APPEARANCEUR CLEAR (A) 02/04/2017 2346   APPEARANCEUR Clear 04/07/2014 1332   LABSPEC 1.025 02/04/2017 2346   LABSPEC 1.013 04/07/2014 1332   PHURINE 5.0 02/04/2017 2346   GLUCOSEU NEGATIVE 02/04/2017 2346   GLUCOSEU Negative 04/07/2014 1332   HGBUR NEGATIVE 02/04/2017 2346   BILIRUBINUR NEGATIVE 02/04/2017 2346   BILIRUBINUR Negative 04/07/2014 1332   KETONESUR NEGATIVE 02/04/2017 2346   PROTEINUR NEGATIVE 02/04/2017 2346   NITRITE NEGATIVE 02/04/2017 2346   LEUKOCYTESUR NEGATIVE 02/04/2017 2346   LEUKOCYTESUR Negative 04/07/2014 1332   Sepsis Labs: '@LABRCNTIP'$ (procalcitonin:4,lacticidven:4) )No results found for this or any previous visit (from the past 240 hour(s)).   Radiological Exams on Admission: Dg Abd Acute W/chest  Result Date: 02/04/2017 CLINICAL  DATA:  Recently discharged with small bowel obstruction this past week. Patient has not been eating since the discharge. Increased weakness. Difficulty walking. Incontinence of urine and stool. One year progressive mental status change. EXAM: DG ABDOMEN ACUTE W/ 1V CHEST COMPARISON:  CT abdomen and pelvis 01/28/2017. Abdominal series 01/28/17. FINDINGS: Elevation of the right hemidiaphragm with atelectasis in the right lung base. Patchy areas of fibrosis or scarring in the right mid lung and left lower lung. Known right lower lobe mass is demonstrated behind the heart shadow at the costophrenic angle. The pulmonary nodules seen in the lower lungs on previous CT are not as well depicted radiographically. No blunting of costophrenic angles. No pneumothorax. Calcified and tortuous aorta. Heart size is normal. Old left rib deformity. Scattered gas and stool throughout the colon. No large or small bowel distention. Changes likely due to ileus or dysmotility. No  free intra- abdominal air. No abnormal air-fluid levels. Degenerative changes in the spine. Make sclerosis and lytic changes in the humeral heads bilaterally could represent avascular necrosis or bone metastasis. IMPRESSION: Unchanged appearance of the chest. Again demonstrated is a a right lower lobe mass with elevation of the right hemidiaphragm and patchy areas of fibrosis or scarring in the right mid lung and left lower lung. Nonobstructive bowel gas pattern. Lucent and sclerotic changes in the femoral heads bilaterally could indicate bone metastasis or avascular necrosis. Electronically Signed   By: Lucienne Capers M.D.   On: 02/04/2017 23:44    EKG: Atrial fibrillation 77 bpm with normal axis and nonspecific ST-T wave changes.   Assessment/Plan  This is a 83 y.o. male with a history of recent hospitalization for small bowel obstruction, arthritis, lung cancer with diffuse metastases, atrial fibrillation, hypertension, dementia now being admitted with:  #. Failure to thrive in a patient with metastatic lung cancer  -Admit to inpatient -Social work and physical therapy consultation have been requested for consideration of placement -Consider palliative care consultation -Follow-up cultures  #. Dehydration, acute kidney injury on CKD and lactic acidosis - IV fluids and repeat BMP in AM.  - Avoid nephrotoxic medications - Bladder scan and place foley catheter if evidence of urinary retention  #. Constipation following SBO (last BM yesterday) -Bowel regimen  #. Protein malnutrition - Nutrition consult  #. Hypothyroidism - Repeat outpatient and consider synthroid.   #. History of arthritis - Continue pain control  #. History of atrial fibrillation - Continue metoprolol, aspirin  #. History of hypertension - Continue metoprolol  #. History of dementia - Continue memantine, donepezil  #. History of lung cancer status post liver biopsy - Follow up outpatient recommendations  if family is to pursue treatment.  #. History of CAD - Continue aspirin.  Admission status: Inpatient IV Fluids: Normal saline Diet/Nutrition: Heart healthy Consults called: Social work, PT. Consider palliative care  DVT Px: Heparin, SCDs and early ambulation. Code Status: DNR/DNI Disposition Plan: To be determined  All the records are reviewed and case discussed with ED provider. Management plans discussed with the patient and/or family who express understanding and agree with plan of care.  Delynda Sepulveda D.O. on 02/05/2017 at 1:05 AM Between 7am to 6pm - Pager - [phone] After 6pm go to [email or web address] - [employer] [facility] [place] [facility] Office [phone] CC: Primary care physician; Marden Noble, MD   02/05/2017, 1:05 AM

## 2017-02-05 NOTE — Progress Notes (Signed)
Spoke with Dr. Estanislado Pandy about pt lactic acid of 2.1. Per Dr. Estanislado Pandy, no recheck of lactic acid will be ordered at this time. Will continue to monitor.

## 2017-02-06 ENCOUNTER — Ambulatory Visit: Payer: Medicare HMO | Admitting: Hematology and Oncology

## 2017-02-06 ENCOUNTER — Other Ambulatory Visit: Payer: Self-pay | Admitting: Pathology

## 2017-02-06 ENCOUNTER — Inpatient Hospital Stay: Payer: Medicare HMO | Admitting: Hematology and Oncology

## 2017-02-06 DIAGNOSIS — R4182 Altered mental status, unspecified: Secondary | ICD-10-CM

## 2017-02-06 DIAGNOSIS — E222 Syndrome of inappropriate secretion of antidiuretic hormone: Secondary | ICD-10-CM

## 2017-02-06 DIAGNOSIS — C7951 Secondary malignant neoplasm of bone: Secondary | ICD-10-CM

## 2017-02-06 DIAGNOSIS — R531 Weakness: Secondary | ICD-10-CM

## 2017-02-06 DIAGNOSIS — I480 Paroxysmal atrial fibrillation: Secondary | ICD-10-CM

## 2017-02-06 DIAGNOSIS — Z7901 Long term (current) use of anticoagulants: Secondary | ICD-10-CM

## 2017-02-06 DIAGNOSIS — C3431 Malignant neoplasm of lower lobe, right bronchus or lung: Secondary | ICD-10-CM

## 2017-02-06 DIAGNOSIS — N189 Chronic kidney disease, unspecified: Secondary | ICD-10-CM

## 2017-02-06 DIAGNOSIS — K5900 Constipation, unspecified: Secondary | ICD-10-CM

## 2017-02-06 LAB — COMPREHENSIVE METABOLIC PANEL
ALT: 120 U/L — AB (ref 17–63)
AST: 203 U/L — AB (ref 15–41)
Albumin: 2.7 g/dL — ABNORMAL LOW (ref 3.5–5.0)
Alkaline Phosphatase: 237 U/L — ABNORMAL HIGH (ref 38–126)
Anion gap: 5 (ref 5–15)
BILIRUBIN TOTAL: 1.7 mg/dL — AB (ref 0.3–1.2)
BUN: 18 mg/dL (ref 6–20)
CALCIUM: 9.1 mg/dL (ref 8.9–10.3)
CHLORIDE: 103 mmol/L (ref 101–111)
CO2: 25 mmol/L (ref 22–32)
CREATININE: 0.96 mg/dL (ref 0.61–1.24)
Glucose, Bld: 103 mg/dL — ABNORMAL HIGH (ref 65–99)
Potassium: 4.6 mmol/L (ref 3.5–5.1)
Sodium: 133 mmol/L — ABNORMAL LOW (ref 135–145)
TOTAL PROTEIN: 7.4 g/dL (ref 6.5–8.1)

## 2017-02-06 LAB — SURGICAL PATHOLOGY

## 2017-02-06 LAB — AMMONIA: AMMONIA: 20 umol/L (ref 9–35)

## 2017-02-06 MED ORDER — LORAZEPAM 2 MG/ML IJ SOLN
INTRAMUSCULAR | Status: AC
  Administered 2017-02-06: 03:00:00 0.5 mg via INTRAVENOUS
  Filled 2017-02-06: qty 1

## 2017-02-06 MED ORDER — ENOXAPARIN SODIUM 40 MG/0.4ML ~~LOC~~ SOLN
40.0000 mg | SUBCUTANEOUS | Status: DC
  Administered 2017-02-06: 21:00:00 40 mg via SUBCUTANEOUS
  Filled 2017-02-06: qty 0.4

## 2017-02-06 MED ORDER — LORAZEPAM 2 MG/ML IJ SOLN
0.5000 mg | Freq: Three times a day (TID) | INTRAMUSCULAR | Status: DC | PRN
  Administered 2017-02-06: 0.5 mg via INTRAVENOUS

## 2017-02-06 MED ORDER — DOCUSATE SODIUM 100 MG PO CAPS: 100.0000 mg | ORAL_CAPSULE | Freq: Two times a day (BID) | ORAL | Status: DC

## 2017-02-06 NOTE — Progress Notes (Signed)
While rounding, [place] made initial visit to room 102. Pt is asleep. Daughter appears anxious. Daughter stated that she is waiting on MD to give results of tests. Daughter is anxious to know what direction they need to go as far as treatment. Daughter states that in addition to cancer in several areas, Pt also suffers with dementia. Daughter states that she has witnessed two sisters with cancer, and does not want to see her father suffer. Prayer for strength is requested, which was provided. CH is available for follow up as needed.    02/06/17 1300  Clinical Encounter Type  Visited With Patient and family together  Visit Type Follow-up;Spiritual support  Referral From Nurse  Consult/Referral To Chaplain  Spiritual Encounters  Spiritual Needs Prayer;Emotional

## 2017-02-06 NOTE — Progress Notes (Signed)
Patient received at 2300 confused. Patient's confusion increased and patient attempting to leave several times. Patient became verbally and physically aggressive towards staff. Staff was unable to redirect. After about 10 minutes of Probation officer, Camera operator and other staff attempting to redirect to no avail, security was called and waited with staff for MD to return call. MD phoned back and ordered ativan. Ativan was effective. Safety maintained. Bed alarm on and functioning without difficulties. Endorsed to oncoming nurse.

## 2017-02-06 NOTE — Progress Notes (Signed)
Initial Nutrition Assessment  DOCUMENTATION CODES:   Severe malnutrition in context of chronic illness  INTERVENTION:  Continue Ensure Enlive po TID as ordered, each supplement provides 350 kcal and 20 grams of protein.  RD has ordered calorie count per MD consult.  Will monitor outcome of discussions regarding goals of care.  NUTRITION DIAGNOSIS:   Malnutrition (Severe) related to chronic illness (metastatic lung cancer) as evidenced by severe depletion of body fat, severe depletion of muscle mass.  GOAL:   Patient will meet greater than or equal to 90% of their needs  MONITOR:   PO intake, Supplement acceptance, Labs, Weight trends, I & O's  REASON FOR ASSESSMENT:   Consult Assessment of nutrition requirement/status (and calorie count)  ASSESSMENT:   83 year old male with PMHx of dementia, HTN, atrial fibrillation with RVR, lung cancer with metastasis found to have FTT, acute renal insufficiency, lactic acidosis. Patient with recent admission 3/10-3/13 due to fecal impaction in rectum.   -Patient found to have lung cancer with mets to bone, liver, lymph nodes. Initial consultation with oncology was 01/18/2017. -Patient was followed by [facility] for skilled nursing, PT, and home palliative care.   RD consult for assessment of nutrition requirements/status and initiation of calorie count. Patient known to this RD from previous admission. On assessment patient was very lethargic. He opened his eyes but was unable to participate meaningfully in assessment. No family at bedside. On previous assessment 3/13 patient had reported to this RD poor appetite for a few months. He had reported eating <50% of three meals daily. On previous assessment had encouraged PO intake of Ensure Enlive BID at home, unsure if patient/family was able to purchase these for home.   He had reported UBW of 160 lbs. Patient has lost 12.3 lbs (7.7% body weight) over unknown time period. Patient was  likely not truly 162 lbs on 2/28 as earlier weights were lower.   Medications reviewed and include: Colace, Remeron 15 mg daily, Miralax, senna, Ativan PRN.  Labs reviewed: Sodium 133, Lactic Acid 2.1.  Nutrition-Focused physical exam completed. Findings are severe fat depletion, severe muscle depletion, and no edema.   Discussed with RN. Patient may be lethargic due to Ativan.   Diet Order:  Diet Heart Room service appropriate? Yes; Fluid consistency: Thin  Skin:  Reviewed, no issues  Last BM:  PTA (02/03/2017 per chart)  Height:   Ht Readings from Last 1 Encounters:  02/05/17 '5\' 11"'$  (1.803 m)    Weight:   Wt Readings from Last 1 Encounters:  02/05/17 147 lb 11.2 oz (67 kg)    Ideal Body Weight:  72.7 kg  BMI:  Body mass index is 20.6 kg/m.  Estimated Nutritional Needs:   Kcal:  2010-2210 (30-33 kcal/kg)  Protein:  80-100 grams (1.2-1.5 grams/kg)  Fluid:  1.6 L/day (25 ml/kg)  EDUCATION NEEDS:   No education needs identified at this time  Willey Blade, MS, RD, LDN Pager: [phone] After Hours Pager: [phone]

## 2017-02-06 NOTE — Progress Notes (Signed)
Visit made patient is currently followed by [facility]. Receiving services of Skilled nursing, Physical Therapy and home Palliative. He was discharged home to his daughter Hilda's house on 3/13, readmitted to [facility] on 3/18 with Failure to thrive. Oncology and Palliative have been consulted. Per chart note review patient had a liver biopsy on 3/15 with results pending. He has known lung ca with possible liver metastasis. Patient seen lying in bed asleep. Per chart note review he received a dose of IV Ativan at 2:34 am due to behaviors. He has not eaten of taken his oral medications due to lethargy this morning. Patient's daughter [name] in earlier and spoke with Probation officer. CMRN [name] aware of [facility] involvement. Will continue to follow and update [facility] team. Transfer summay placed on hospital chart. Thank you. Flo Shanks RN, BAN, [facility], hospital liaison [phone] c

## 2017-02-06 NOTE — Progress Notes (Signed)
PT Cancellation Note  Patient Details Name: Wayne Le MRN: [id number] DOB: July 17, 1927   Cancelled Treatment:    Reason Eval/Treat Not Completed: Fatigue/lethargy limiting ability to participate.  PT consult received.  Chart reviewed.  Pt soundly sleeping upon PT entering room and did not wake to verbal or tactile cues (nursing aware and reports pt was given Ativan this morning).  Will re-attempt PT eval at a later date/time.  Leitha Bleak, PT 02/06/17, 3:44 PM [phone]

## 2017-02-06 NOTE — Care Management (Signed)
Admitted to [facility] with the diagnosis of failure to thrive. Discharged from this facility 01/31/17 to the home of daughter, Hilario Quarry ([phone]).  [facility] seen Mr. Emily 02/03/17. Sees Dr. Brynda Greathouse and Nehemiah Massed.  Physical therapy evaluation pending further plans.  Shelbie Ammons RN MSN CCM Care Management

## 2017-02-06 NOTE — Progress Notes (Signed)
[facility] Hematology/Oncology Progress Note  Date of admission: 02/04/2017  Hospital day:  02/06/2017  Chief Complaint: Wayne Le is a 83 y.o. male with metastatic lung cancer who was admitted with altered mental status.  HPI:  The patient's daughter notes progressive weakness s/p multiple recent visits to to the ER.  He was admitted 01/28/2017 - 01/31/2017 with paroxysmal atrial fibrillation and constipation.  He was discharged with home health, PT and skilled nursing.  He underwent ultrasound guided liver biopsy on 02/02/2017.  He represented to the [facility] ER on 02/04/2017 with altered mental status and decreased ability to care for himself.  Oral increase had decreased.  His daughter could no longer care for him at home.  He had acute renal insufficiency and received fluids.  He was initially placed on antibiotics secondary to presumed sepsis with an elevated lactate. 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  Head CT without contrast on 02/05/2017 revealed no acute abnormality.  Subjective:   The patient is unresponsive to sternal rub.   Social History: The patient is accompanied by his daughter, Wayne Le, today.  Allergies:  Allergies  Allergen Reactions  . Iodine Swelling  . Other Other (See Comments)  . Penicillins Hives and Rash    Scheduled Medications: . aspirin EC  81 mg Oral Daily  . docusate sodium  100 mg Oral BID  . memantine  28 mg Oral Daily   And  . donepezil  10 mg Oral Daily  . dutasteride  0.5 mg Oral Daily  . enoxaparin (LOVENOX) injection  40 mg Subcutaneous Q24H  . feeding supplement (ENSURE ENLIVE)  237 mL Oral TID BM  . lidocaine  2 patch Transdermal Q24H  . metoprolol tartrate  25 mg Oral BID  . mirtazapine  15 mg Oral QHS  . polyethylene glycol  17 g Oral Daily  . senna-docusate  1 tablet Oral BID    Review of Systems: Unable to perform.  Patient unresponsive.  Physical Exam: Blood pressure 133/76, pulse 100,  temperature 98.2 F (36.8 C), temperature source Axillary, resp. rate 20, height 5' 11"  (1.803 m), weight 147 lb 11.2 oz (67 kg), SpO2 98 %.  GENERAL:  Thin elderly gentleman lying on right side, unresponsive to name or sternal rub. MENTAL STATUS: Unresponsive. HEAD:  Gray curly hair.  Temporal wasting.  Normocephalic, atraumatic, face symmetric, no Cushingoid features. EYES:  Brown eyes.  Pupils equal round and minimally reactive to light and accomodation.  No conjunctivitis or scleral icterus. ENT:  Oropharynx with thick secretions.  RESPIRATORY:  Clear to auscultation anteriorly without rales, wheezes or rhonchi. CARDIOVASCULAR:  Regular rate and rhythm without murmur, rub or gallop. ABDOMEN:  Soft, non-tender, with active bowel sounds, and no hepatosplenomegaly.  No masses. SKIN:  No rashes, ulcers or lesions. EXTREMITIES: Thin.  No edema, no skin discoloration or tenderness.  No palpable cords. LYMPH NODES: No palpable cervical, supraclavicular, axillary or inguinal adenopathy  NEUROLOGICAL: Unresponsive.  Does not withdraw to pain.  Bilateral patella DTR 0.  No clonus or Babinski.   Results for orders placed or performed during the hospital encounter of 02/04/17 (from the past 48 hour(s))  Comprehensive metabolic panel     Status: Abnormal   Collection Time: 02/04/17 10:19 PM  Result Value Ref Range   Sodium 134 (L) 135 - 145 mmol/L   Potassium 4.7 3.5 - 5.1 mmol/L   Chloride 102 101 - 111 mmol/L   CO2 22 22 - 32 mmol/L   Glucose,  Bld 222 (H) 65 - 99 mg/dL   BUN 28 (H) 6 - 20 mg/dL   Creatinine, Ser 1.40 (H) 0.61 - 1.24 mg/dL   Calcium 9.8 8.9 - 10.3 mg/dL   Total Protein 7.9 6.5 - 8.1 g/dL   Albumin 2.9 (L) 3.5 - 5.0 g/dL   AST 185 (H) 15 - 41 U/L   ALT 105 (H) 17 - 63 U/L   Alkaline Phosphatase 230 (H) 38 - 126 U/L   Total Bilirubin 1.2 0.3 - 1.2 mg/dL   GFR calc non Af Amer 44 (L) >60 mL/min   GFR calc Af Amer 51 (L) >60 mL/min    Comment: (NOTE) The eGFR has been  calculated using the CKD EPI equation. This calculation has not been validated in all clinical situations. eGFR's persistently <60 mL/min signify possible Chronic Kidney Disease.    Anion gap 10 5 - 15  CBC     Status: Abnormal   Collection Time: 02/04/17 10:19 PM  Result Value Ref Range   WBC 7.1 3.8 - 10.6 K/uL   RBC 6.28 (H) 4.40 - 5.90 MIL/uL   Hemoglobin 15.1 13.0 - 18.0 g/dL   HCT 46.5 40.0 - 52.0 %   MCV 74.2 (L) 80.0 - 100.0 fL   MCH 24.1 (L) 26.0 - 34.0 pg   MCHC 32.5 32.0 - 36.0 g/dL   RDW 15.2 (H) 11.5 - 14.5 %   Platelets 171 150 - 440 K/uL  Troponin I     Status: None   Collection Time: 02/04/17 10:19 PM  Result Value Ref Range   Troponin I <0.03 <0.03 ng/mL  TSH     Status: Abnormal   Collection Time: 02/04/17 10:19 PM  Result Value Ref Range   TSH 16.350 (H) 0.350 - 4.500 uIU/mL    Comment: Performed by a 3rd Generation assay with a functional sensitivity of <=0.01 uIU/mL.  T4, free     Status: Abnormal   Collection Time: 02/04/17 10:19 PM  Result Value Ref Range   Free T4 1.19 (H) 0.61 - 1.12 ng/dL    Comment: (NOTE) Biotin ingestion may interfere with free T4 tests. If the results are inconsistent with the TSH level, previous test results, or the clinical presentation, then consider biotin interference. If needed, order repeat testing after stopping biotin.   Lactic acid, plasma     Status: Abnormal   Collection Time: 02/04/17 10:53 PM  Result Value Ref Range   Lactic Acid, Venous 2.8 (HH) 0.5 - 1.9 mmol/L    Comment: CRITICAL RESULT CALLED TO, READ BACK BY AND VERIFIED WITH APRIL BRUMGARD AT 2330 02/04/17.PMH  Urinalysis, Complete w Microscopic     Status: Abnormal   Collection Time: 02/04/17 11:46 PM  Result Value Ref Range   Color, Urine AMBER (A) YELLOW    Comment: BIOCHEMICALS MAY BE AFFECTED BY COLOR   APPearance CLEAR (A) CLEAR   Specific Gravity, Urine 1.025 1.005 - 1.030   pH 5.0 5.0 - 8.0   Glucose, UA NEGATIVE NEGATIVE mg/dL   Hgb urine  dipstick NEGATIVE NEGATIVE   Bilirubin Urine NEGATIVE NEGATIVE   Ketones, ur NEGATIVE NEGATIVE mg/dL   Protein, ur NEGATIVE NEGATIVE mg/dL   Nitrite NEGATIVE NEGATIVE   Leukocytes, UA NEGATIVE NEGATIVE   RBC / HPF 0-5 0 - 5 RBC/hpf   WBC, UA 0-5 0 - 5 WBC/hpf   Bacteria, UA RARE (A) NONE SEEN   Squamous Epithelial / LPF 0-5 (A) NONE SEEN   Mucous PRESENT  Hyaline Casts, UA PRESENT   Blood culture (routine x 2)     Status: None (Preliminary result)   Collection Time: 02/05/17 12:10 AM  Result Value Ref Range   Specimen Description BLOOD LFA    Special Requests BOTTLES DRAWN AEROBIC AND ANAEROBIC LOW VOLUME    Culture NO GROWTH 1 DAY    Report Status PENDING   Blood culture (routine x 2)     Status: None (Preliminary result)   Collection Time: 02/05/17 12:20 AM  Result Value Ref Range   Specimen Description BLOOD LFA    Special Requests BOTTLES DRAWN AEROBIC AND ANAEROBIC LOW VOLUME    Culture NO GROWTH 1 DAY    Report Status PENDING   Lactic acid, plasma     Status: Abnormal   Collection Time: 02/05/17  1:52 AM  Result Value Ref Range   Lactic Acid, Venous 2.1 (HH) 0.5 - 1.9 mmol/L    Comment: CRITICAL RESULT CALLED TO, READ BACK BY AND VERIFIED WITH KASEY VAUGHANS WARD AT 0230 02/05/17.PMH  Comprehensive metabolic panel     Status: Abnormal   Collection Time: 02/06/17  4:59 AM  Result Value Ref Range   Sodium 133 (L) 135 - 145 mmol/L   Potassium 4.6 3.5 - 5.1 mmol/L   Chloride 103 101 - 111 mmol/L   CO2 25 22 - 32 mmol/L   Glucose, Bld 103 (H) 65 - 99 mg/dL   BUN 18 6 - 20 mg/dL   Creatinine, Ser 0.96 0.61 - 1.24 mg/dL   Calcium 9.1 8.9 - 10.3 mg/dL   Total Protein 7.4 6.5 - 8.1 g/dL   Albumin 2.7 (L) 3.5 - 5.0 g/dL   AST 203 (H) 15 - 41 U/L   ALT 120 (H) 17 - 63 U/L   Alkaline Phosphatase 237 (H) 38 - 126 U/L   Total Bilirubin 1.7 (H) 0.3 - 1.2 mg/dL   GFR calc non Af Amer >60 >60 mL/min   GFR calc Af Amer >60 >60 mL/min    Comment: (NOTE) The eGFR has been  calculated using the CKD EPI equation. This calculation has not been validated in all clinical situations. eGFR's persistently <60 mL/min signify possible Chronic Kidney Disease.    Anion gap 5 5 - 15  Ammonia     Status: None   Collection Time: 02/06/17  4:22 PM  Result Value Ref Range   Ammonia 20 9 - 35 umol/L   Ct Head Wo Contrast  Result Date: 02/05/2017 CLINICAL DATA:  Altered mental status EXAM: CT HEAD WITHOUT CONTRAST TECHNIQUE: Contiguous axial images were obtained from the base of the skull through the vertex without intravenous contrast. COMPARISON:  Head CT 01/28/2017 FINDINGS: Brain: No mass lesion, intraparenchymal hemorrhage or extra-axial collection. No evidence of acute cortical infarct. Unchanged moderate atrophy. There is periventricular hypoattenuation compatible with chronic microvascular disease. 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 Vascular: Atherosclerotic calcification of the vertebral and internal carotid arteries at the skull base. Skull: Normal visualized skull base, calvarium and extracranial soft tissues. Sinuses/Orbits: No sinus fluid levels or advanced mucosal thickening. No mastoid effusion. Normal orbits. IMPRESSION: 1. No acute intracranial abnormality. 2. Moderate atrophy and chronic microvascular ischemia. Electronically Signed   By: Ulyses Jarred M.D.   On: 02/05/2017 01:37   Dg Abd Acute W/chest  Result Date: 02/04/2017 CLINICAL DATA:  Recently discharged with small bowel obstruction this past week. Patient has not been eating since the discharge. Increased weakness. Difficulty walking. Incontinence of urine and stool. One  year progressive mental status change. EXAM: DG ABDOMEN ACUTE W/ 1V CHEST COMPARISON:  CT abdomen and pelvis 01/28/2017. Abdominal series 01/28/17. FINDINGS: Elevation of the right hemidiaphragm with atelectasis in the right lung base. Patchy areas of fibrosis or scarring in the right mid lung and left lower lung. Known right  lower lobe mass is demonstrated behind the heart shadow at the costophrenic angle. The pulmonary nodules seen in the lower lungs on previous CT are not as well depicted radiographically. No blunting of costophrenic angles. No pneumothorax. Calcified and tortuous aorta. Heart size is normal. Old left rib deformity. Scattered gas and stool throughout the colon. No large or small bowel distention. Changes likely due to ileus or dysmotility. No free intra- abdominal air. No abnormal air-fluid levels. Degenerative changes in the spine. Make sclerosis and lytic changes in the humeral heads bilaterally could represent avascular necrosis or bone metastasis. IMPRESSION: Unchanged appearance of the chest. Again demonstrated is a a right lower lobe mass with elevation of the right hemidiaphragm and patchy areas of fibrosis or scarring in the right mid lung and left lower lung. Nonobstructive bowel gas pattern. Lucent and sclerotic changes in the femoral heads bilaterally could indicate bone metastasis or avascular necrosis. Electronically Signed   By: Lucienne Capers M.D.   On: 02/04/2017 23:44    Assessment:  KABLE HAYWOOD is a 83 y.o. male with extensive stage small cell lung cancer.  Liver biopsy on 02/02/2017 confirmed metastatic small cell carcinoma  of lung origin.  PET scan on 01/25/2017 revealed a dominant 6.3 x 5.7 cm right lower lobe lung mass which was hypermetabolic (SUV 9.6).  There were multiple hypermetabolic pulmonary nodules, mediastinal, right hilar and porta hepatis lymph nodes and extensive hepatic lesions consistent with stage IV lung cancer.  There was possible small osseous metastases within the left inferior pubic ramus and near the right seventh costovertebral junction.  Head MRI without contrast on 01/25/2017 revealed no acute intracranial abnormality.  Head CT without contrast on 02/05/2017 revealed no acute abnormality.  He may have underlying CNS metastasis not seen without contrast.  He  has dementia.  His performance status has declined (PS 4).  He is unable to care for himself.  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 He has increased liver function tests secondary to liver metastasis  Code status is DNR/DNI.  Plan: 1.  Oncology:  Discussed at length with patient's daughter, his diagnosis of extensive stage small cell lung cancer.  Disease is not curable.  Without treatment, survival < 6 weeks.  He has had a rapid decline in the past week and is currently unresponsive.  Life expectancy short.  Discuss no role for chemotherapy.  Patient's daughter agrees.  Discuss supportive care and keeping him comfortable.  Patient's daughter wishes to avoid painful interventions (lab draws, etc).  Discuss Hospice at home or Hospice Home.  Numerous questions asked and answered.  Support provided patient's daughter.   Lequita Asal, MD  02/06/2017, 5:35 PM

## 2017-02-06 NOTE — Progress Notes (Signed)
Wayne Le    MR#:  [id number]  DATE OF BIRTH:  [birth year]  SUBJECTIVE:  CHIEF COMPLAINT:   Chief Complaint  Patient presents with  . Altered Mental Status   The patient is a 83 year old male with medical history significant for history of right lower lobe mass, concerning for lung cancer, metastasis to liver and bone, status post liver mass biopsy 02/02/2017, pathology results are pending, who presents to the hospital with complaints of upper abdominal pain, constipation, failure to thrive, nausea, weight loss. His labs revealed dehydration with creatinine level of 1.4, elevated liver enzymes, lactic acid level was also elevated. X-ray of abdomen and chest was unremarkable. She was admitted to the hospital for further evaluation and treatment. Urinalysis was unremarkable. He was seen by oncologist, who recommended to follow-up with primary oncologist and palliative care evaluation. The patient is very somnolent today after he was given ativan last night  Review of Systems  Unable to perform ROS: Mental acuity    VITAL SIGNS: Blood pressure 133/76, pulse 100, temperature 98.2 F (36.8 C), temperature source Axillary, resp. rate 20, height '5\' 11"'$  (1.803 m), weight 67 kg (147 lb 11.2 oz), SpO2 98 %.  PHYSICAL EXAMINATION:   GENERAL:  83 y.o.-year-old patient lying in the bed with no acute distress, sleeping, unable to wake patient up with light shake and verbal simulation.  EYES: Pupils not able to assess Not able to assess for scleral icterus. Extraocular muscles intact.  HEENT: Head atraumatic, normocephalic. Oropharynx and nasopharynx clear.  NECK:  Supple, no jugular venous distention. No thyroid enlargement, no tenderness.  LUNGS: Normal breath sounds bilaterally, no wheezing, rales,rhonchi or crepitation. No use of accessory muscles of respiration.  CARDIOVASCULAR: S1, S2 normal. No murmurs, rubs,  or gallops.  ABDOMEN: Soft, mild discomfort in upper abdomen, enlarged Liver with painful edge, nondistended. Bowel sounds present. No organomegaly or mass.  EXTREMITIES: No pedal edema, cyanosis, or clubbing.  NEUROLOGIC: Cranial nerves II through XII unable to assess. Muscle strength unable to evaluate this patient is asleep. Sensation unable to evaluate. Gait not checked.  PSYCHIATRIC: The patient is sleeping  SKIN: No obvious rash, lesion, or ulcer.   ORDERS/RESULTS REVIEWED:   CBC  Recent Labs Lab 02/02/17 0713 02/04/17 2219  WBC 5.5 7.1  HGB 14.3 15.1  HCT 44.3 46.5  PLT 154 171  MCV 75.0* 74.2*  MCH 24.2* 24.1*  MCHC 32.3 32.5  RDW 15.4* 15.2*   ------------------------------------------------------------------------------------------------------------------  Chemistries   Recent Labs Lab 02/04/17 2219 02/06/17 0459  NA 134* 133*  K 4.7 4.6  CL 102 103  CO2 22 25  GLUCOSE 222* 103*  BUN 28* 18  CREATININE 1.40* 0.96  CALCIUM 9.8 9.1  AST 185* 203*  ALT 105* 120*  ALKPHOS 230* 237*  BILITOT 1.2 1.7*   ------------------------------------------------------------------------------------------------------------------ estimated creatinine clearance is 51.4 mL/min (by C-G formula based on SCr of 0.96 mg/dL). ------------------------------------------------------------------------------------------------------------------  Recent Labs  02/04/17 2219  TSH 16.350*    Cardiac Enzymes  Recent Labs Lab 02/04/17 2219  TROPONINI <0.03   ------------------------------------------------------------------------------------------------------------------ Invalid input(s): POCBNP ---------------------------------------------------------------------------------------------------------------  RADIOLOGY: Ct Head Wo Contrast  Result Date: 02/05/2017 CLINICAL DATA:  Altered mental status EXAM: CT HEAD WITHOUT CONTRAST TECHNIQUE: Contiguous axial images were obtained  from the base of the skull through the vertex without intravenous contrast. COMPARISON:  Head CT 01/28/2017 FINDINGS: Brain: No mass lesion, intraparenchymal hemorrhage or extra-axial collection. No evidence of  acute cortical infarct. Unchanged moderate atrophy. There is periventricular hypoattenuation compatible with chronic microvascular disease. 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 Vascular: Atherosclerotic calcification of the vertebral and internal carotid arteries at the skull base. Skull: Normal visualized skull base, calvarium and extracranial soft tissues. Sinuses/Orbits: No sinus fluid levels or advanced mucosal thickening. No mastoid effusion. Normal orbits. IMPRESSION: 1. No acute intracranial abnormality. 2. Moderate atrophy and chronic microvascular ischemia. Electronically Signed   By: Ulyses Jarred M.D.   On: 02/05/2017 01:37   Dg Abd Acute W/chest  Result Date: 02/04/2017 CLINICAL DATA:  Recently discharged with small bowel obstruction this past week. Patient has not been eating since the discharge. Increased weakness. Difficulty walking. Incontinence of urine and stool. One year progressive mental status change. EXAM: DG ABDOMEN ACUTE W/ 1V CHEST COMPARISON:  CT abdomen and pelvis 01/28/2017. Abdominal series 01/28/17. FINDINGS: Elevation of the right hemidiaphragm with atelectasis in the right lung base. Patchy areas of fibrosis or scarring in the right mid lung and left lower lung. Known right lower lobe mass is demonstrated behind the heart shadow at the costophrenic angle. The pulmonary nodules seen in the lower lungs on previous CT are not as well depicted radiographically. No blunting of costophrenic angles. No pneumothorax. Calcified and tortuous aorta. Heart size is normal. Old left rib deformity. Scattered gas and stool throughout the colon. No large or small bowel distention. Changes likely due to ileus or dysmotility. No free intra- abdominal air. No abnormal  air-fluid levels. Degenerative changes in the spine. Make sclerosis and lytic changes in the humeral heads bilaterally could represent avascular necrosis or bone metastasis. IMPRESSION: Unchanged appearance of the chest. Again demonstrated is a a right lower lobe mass with elevation of the right hemidiaphragm and patchy areas of fibrosis or scarring in the right mid lung and left lower lung. Nonobstructive bowel gas pattern. Lucent and sclerotic changes in the femoral heads bilaterally could indicate bone metastasis or avascular necrosis. Electronically Signed   By: Lucienne Capers M.D.   On: 02/04/2017 23:44    EKG:  Orders placed or performed during the hospital encounter of 02/04/17  . EKG 12-Lead  . EKG 12-Lead  . EKG 12-Lead    ASSESSMENT AND PLAN:  Active Problems:   Failure to thrive in adult #1 failure to thrive in adult, due to metastatic lung cancer, liver distention, pain, constipation. Dietary consultation was obtained for calorie count, palliative care , oncology recommendations are pending. Pathology results are pending #2. Acute renal insufficiency, resolved on IV fluids #3. Hyponatremia, worsened with IV fluids, concerning for SIADH, get urine osmolality, repeat sodium level in the morning #4. Elevated transaminases, due to metastatic liver disease, status post recent biopsy, relatively stable LFTs #5. Lactic acidosis, improved with IV fluid administration, no obvious infection, now off antibiotic therapy, blood cultures are negative so far #6. Altered mental state after Ativan administration, get ammonia level, suspend Ativan #7. Agitation as per history, discontinue Ativan. Due to significant somnolence was administration, initiate Haldol if needed, get ammonia level    Management plans discussed with the patient's sister and they are in agreement.   DRUG ALLERGIES:  Allergies  Allergen Reactions  . Iodine Swelling  . Other Other (See Comments)  . Penicillins Hives  and Rash    CODE STATUS:     Code Status Orders        Start     Ordered   02/05/17 0211  Do not attempt resuscitation (DNR)  Continuous  Question Answer Comment  In the event of cardiac or respiratory ARREST Do not call a "code blue"   In the event of cardiac or respiratory ARREST Do not perform Intubation, CPR, defibrillation or ACLS   In the event of cardiac or respiratory ARREST Use medication by any route, position, wound care, and other measures to relive pain and suffering. May use oxygen, suction and manual treatment of airway obstruction as needed for comfort.      02/05/17 0210    Code Status History    Date Active Date Inactive Code Status Order ID Comments User Context   01/29/2017  1:00 PM 01/31/2017  2:23 PM DNR [id number]  Demetrios Loll, MD Inpatient   01/28/2017 11:29 PM 01/29/2017  1:00 PM Full Code [id number]  Lance Coon, MD Inpatient   08/24/2016  4:14 PM 08/25/2016  6:34 PM Full Code [id number]  Theodoro Grist, MD Inpatient    Advance Directive Documentation     Most Recent Value  Type of Advance Directive  Healthcare Power of Attorney  Pre-existing out of facility DNR order (yellow form or pink MOST form)  -  "MOST" Form in Place?  -      TOTAL TIME TAKING CARE OF THIS PATIENT: 35 minutes.    Theodoro Grist M.D on 02/06/2017 at 4:14 PM  Between 7am to 6pm - Pager - [phone]  After 6pm go to [email or web address] - password [email or web address] [facility]  Office  [phone]  CC: Primary care physician; Marden Noble, MD

## 2017-02-06 NOTE — Progress Notes (Addendum)
Second attempt made to provide patient with PO meds, however patient still very drowsy/ lethargic and unable to stay awake long enough to consume medications at this time. MD- Dr. Ether Griffins paged and made aware.  Vital signs stable. Respirations regular and unlabored. Patient resting quietly and no acute distress.   Previous attempt to give patient PO medications made at approximately 1140 this morning.

## 2017-02-07 DIAGNOSIS — E872 Acidosis, unspecified: Secondary | ICD-10-CM

## 2017-02-07 DIAGNOSIS — Z66 Do not resuscitate: Secondary | ICD-10-CM

## 2017-02-07 DIAGNOSIS — C799 Secondary malignant neoplasm of unspecified site: Secondary | ICD-10-CM

## 2017-02-07 DIAGNOSIS — R109 Unspecified abdominal pain: Secondary | ICD-10-CM

## 2017-02-07 DIAGNOSIS — C3490 Malignant neoplasm of unspecified part of unspecified bronchus or lung: Secondary | ICD-10-CM

## 2017-02-07 DIAGNOSIS — G9340 Encephalopathy, unspecified: Secondary | ICD-10-CM

## 2017-02-07 DIAGNOSIS — R52 Pain, unspecified: Secondary | ICD-10-CM

## 2017-02-07 DIAGNOSIS — R7401 Elevation of levels of liver transaminase levels: Secondary | ICD-10-CM

## 2017-02-07 DIAGNOSIS — R740 Nonspecific elevation of levels of transaminase and lactic acid dehydrogenase [LDH]: Secondary | ICD-10-CM

## 2017-02-07 DIAGNOSIS — Z515 Encounter for palliative care: Secondary | ICD-10-CM

## 2017-02-07 DIAGNOSIS — E871 Hypo-osmolality and hyponatremia: Secondary | ICD-10-CM

## 2017-02-07 LAB — SODIUM: SODIUM: 133 mmol/L — AB (ref 135–145)

## 2017-02-07 MED ORDER — MORPHINE SULFATE (CONCENTRATE) 10 MG/0.5ML PO SOLN
5.0000 mg | ORAL | Status: DC | PRN
  Administered 2017-02-07 (×2): 5 mg via ORAL
  Filled 2017-02-07 (×3): qty 1

## 2017-02-07 MED ORDER — LIDOCAINE 5 % EX PTCH: 2.0000 | MEDICATED_PATCH | CUTANEOUS | 0 refills | Status: DC

## 2017-02-07 MED ORDER — ENSURE ENLIVE PO LIQD: 237.0000 mL | Freq: Three times a day (TID) | ORAL | 12 refills | Status: AC

## 2017-02-07 MED ORDER — BISACODYL 10 MG RE SUPP: 10.0000 mg | Freq: Every day | RECTAL | Status: DC | PRN

## 2017-02-07 MED ORDER — LORAZEPAM 1 MG PO TABS: 1.0000 mg | ORAL_TABLET | ORAL | Status: DC | PRN

## 2017-02-07 MED ORDER — OXYCODONE HCL 5 MG PO TABS: 5.0000 mg | ORAL_TABLET | ORAL | 0 refills | Status: DC | PRN

## 2017-02-07 MED ORDER — SENNOSIDES-DOCUSATE SODIUM 8.6-50 MG PO TABS: 1.0000 | ORAL_TABLET | Freq: Two times a day (BID) | ORAL | 1 refills | Status: AC

## 2017-02-07 NOTE — Consult Note (Signed)
Consultation Note Date: 02/07/2017   Patient Name: Wayne Le  DOB: 12-07-1926  MRN: [id number]  Age / Sex: 83 y.o., male  PCP: Marden Noble, MD Referring Physician: Theodoro Grist, MD  Reason for Consultation: Establishing goals of care and Psychosocial/spiritual support  HPI/Patient Profile: 83 y.o. male   admitted on 02/04/2017 with progressive weakness s/p multiple recent visits to to the ER.    He was admitted 01/28/2017 - 01/31/2017 with paroxysmal atrial fibrillation and constipation.  He was discharged with home health, PT and skilled nursing.  PET scan on 01/25/2017 revealed a dominant 6.3 x 5.7 cm right lower lobe lung mass which was hypermetabolic (SUV 9.6).  There were multiple hypermetabolic pulmonary nodules, mediastinal, right hilar and porta hepatis lymph nodes and extensive hepatic lesions consistent with stage IV lung cancer.  There was possible small osseous metastases within the left inferior pubic ramus and near the right seventh costovertebral junction.  He underwent ultrasound guided liver biopsy on 02/02/2017.  Per oncology there is no role for chemotherapy, prognosis is limited    Family face advanced directive decisions and anticipatory care needs.  Clinical Assessment and Goals of Care:  This NP Wadie Lessen reviewed medical records, received report from team, assessed the patient and then meet at the patient's bedside along with his two daughters  to discuss diagnosis, prognosis, GOC, EOL wishes disposition and options.  A detailed discussion was had today regarding advanced directives.  Concepts specific to code status, artifical feeding and hydration, continued IV antibiotics and rehospitalization was had.  The difference between a aggressive medical intervention path  and a palliative comfort care path for this patient at this time was had.  Values and goals of care  important to patient and family were attempted to be elicited.  Concept of Hospice and Palliative Care were discussed  Natural trajectory and expectations at EOL were discussed.  Questions and concerns addressed.   Family encouraged to call with questions or concerns.  PMT will continue to support holistically.  Hilario Quarry is HPOA with the support of her sister [name]  SUMMARY OF RECOMMENDATIONS     COde Status/Advance Care Planning:  DNR   Symptom Management:   Pain/Dyspnea: Roxanol 5 mg po/sl every 1 hr prn  Agitation: Ativan 1 mg po/sl every 4 hrs prn  Palliative Prophylaxis:   Frequent pain assessment, position for comfort, mouth care  Additional Recommendations (Limitations, Scope, Preferences):  Full Comfort Care  Psycho-social/Spiritual:   Desire for further Chaplaincy support:  no  Additional Recommendations: Grief support  Prognosis:   < 2 weeks  Discharge Planning: Hospice facility      Primary Diagnoses: Present on Admission: . Failure to thrive in adult   I have reviewed the medical record, interviewed the patient and family, and examined the patient. The following aspects are pertinent.  Past Medical History:  Diagnosis Date  . Arthritis   . Cancer ([place])   . Cataract   . Dysrhythmia    Atrial fib  . Hypertension   .  Memory changes   . Prostate disorder    Social History   Social History  . Marital status: Widowed    Spouse name: N/A  . Number of children: N/A  . Years of education: N/A   Social History Main Topics  . Smoking status: Current Every Day Smoker    Packs/day: 0.25  . Smokeless tobacco: Never Used     Comment: Pack lasts one week  . Alcohol use No  . Drug use: No  . Sexual activity: Not Asked   Other Topics Concern  . None   Social History Narrative  . None   Family History  Problem Relation Age of Onset  . Cancer Brother    Scheduled Meds: . aspirin EC  81 mg Oral Daily  . docusate sodium  100 mg  Oral BID  . memantine  28 mg Oral Daily   And  . donepezil  10 mg Oral Daily  . dutasteride  0.5 mg Oral Daily  . enoxaparin (LOVENOX) injection  40 mg Subcutaneous Q24H  . feeding supplement (ENSURE ENLIVE)  237 mL Oral TID BM  . lidocaine  2 patch Transdermal Q24H  . metoprolol tartrate  25 mg Oral BID  . polyethylene glycol  17 g Oral Daily  . senna-docusate  1 tablet Oral BID   Continuous Infusions: PRN Meds:.albuterol, ipratropium, ondansetron **OR** ondansetron (ZOFRAN) IV, oxyCODONE Medications Prior to Admission:  Prior to Admission medications   Medication Sig Start Date End Date Taking? Authorizing Provider  aspirin EC 81 MG tablet Take 81 mg by mouth daily.   Yes Historical Provider, MD  docusate sodium (COLACE) 50 MG capsule Take 50 mg by mouth daily.   Yes Historical Provider, MD  dutasteride (AVODART) 0.5 MG capsule Take 1 capsule by mouth daily. 08/08/16  Yes Historical Provider, MD  metoprolol tartrate (LOPRESSOR) 25 MG tablet Take 1 tablet (25 mg total) by mouth 2 (two) times daily. 01/30/17  Yes Demetrios Loll, MD  mirtazapine (REMERON) 15 MG tablet Take 15 mg by mouth at bedtime.   Yes Historical Provider, MD  NAMZARIC 28-10 MG CP24 Take 1 capsule by mouth daily. 07/25/16  Yes Historical Provider, MD  oxyCODONE-acetaminophen (PERCOCET/ROXICET) 5-325 MG tablet Take 1 tablet by mouth every 6 (six) hours as needed for moderate pain. 01/31/17  Yes Demetrios Loll, MD  polyethylene glycol [facility]) packet Take 17 g by mouth daily. 01/27/17  Yes Gregor Hams, MD  feeding supplement, ENSURE ENLIVE, (ENSURE ENLIVE) LIQD Take 237 mLs by mouth 3 (three) times daily between meals. 02/07/17   Theodoro Grist, MD  lidocaine (LIDODERM) 5 % Place 2 patches onto the skin daily. Remove & Discard patch within 12 hours or as directed by MD 02/07/17   Theodoro Grist, MD  oxyCODONE (OXY IR/ROXICODONE) 5 MG immediate release tablet Take 1 tablet (5 mg total) by mouth every 4 (four) hours as needed for moderate  pain. 02/07/17   Theodoro Grist, MD  senna-docusate (SENOKOT-S) 8.6-50 MG tablet Take 1 tablet by mouth 2 (two) times daily. 02/07/17   Theodoro Grist, MD   Allergies  Allergen Reactions  . Iodine Swelling  . Other Other (See Comments)  . Penicillins Hives and Rash   Review of Systems  Unable to perform ROS: Acuity of condition    Physical Exam  Constitutional: He appears lethargic. He appears cachectic. He appears ill.  Cardiovascular: Tachycardia present.   Pulmonary/Chest: He has decreased breath sounds in the right lower field and the left lower field.  Neurological: He appears lethargic.  Skin: Skin is warm and dry.    Vital Signs: BP (!) 103/53   Pulse (!) 109   Temp 97.7 F (36.5 C) (Oral)   Resp 20   Ht '5\' 11"'$  (1.803 m)   Wt 67 kg (147 lb 11.2 oz)   SpO2 96%   BMI 20.60 kg/m  Pain Assessment: 0-10   Pain Score: Asleep   SpO2: SpO2: 96 % O2 Device:SpO2: 96 % O2 Flow Rate: .   IO: Intake/output summary: No intake or output data in the 24 hours ending 02/07/17 1518  LBM: Last BM Date: 02/03/17 Baseline Weight: Weight: 64 kg (141 lb) Most recent weight: Weight: 67 kg (147 lb 11.2 oz)     Palliative Assessment/Data:   Flowsheet Rows     Most Recent Value  Intake Tab  Referral Department  Hospitalist  Unit at Time of Referral  Oncology Unit  Palliative Care Primary Diagnosis  Cancer  Date Notified  02/05/17  Palliative Care Type  New Palliative care  Reason for referral  Clarify Goals of Care  Date of Admission  02/04/17  # of days IP prior to Palliative referral  1  Clinical Assessment  Psychosocial & Spiritual Assessment  Palliative Care Outcomes     Discussed with Dr Eugenia Mcalpine   Time In: 1400 Time Out: 1515 Time Total: 75 min Greater than 50%  of this time was spent counseling and coordinating care related to the above assessment and plan.  Signed by: Wadie Lessen, NP   Please contact Palliative Medicine Team phone at [phone] for questions  and concerns.  For individual provider: See Shea Evans

## 2017-02-07 NOTE — Progress Notes (Signed)
[facility] [place] NAME: Wayne Le    MR#:  [id number]  DATE OF BIRTH:  12-31-1926  SUBJECTIVE:  CHIEF COMPLAINT:   Chief Complaint  Patient presents with  . Altered Mental Status   The patient is a 83 year old male with medical history significant for history of right lower lobe mass, concerning for lung cancer, metastasis to liver and bone, status post liver mass biopsy 02/02/2017, pathology results are pending, who presents to the hospital with complaints of upper abdominal pain, constipation, failure to thrive, nausea, weight loss. His labs revealed dehydration with creatinine level of 1.4, elevated liver enzymes, lactic acid level was also elevated. X-ray of abdomen and chest was unremarkable. She was admitted to the hospital for further evaluation and treatment. Urinalysis was unremarkable. He was seen by oncologist, who recommended to follow-up with primary oncologist and palliative care evaluation. The patient Remains very  somnolent today after Ativan and Remeron yesterday. Not able to review systems. Patient was seen by oncologist, who confirmed that patient's biopsy revealed small cell lung cancer, which is incurable, she recommended hospice. Patient's family was seen by palliative care, they chose hospice home, patient will be discharged to hospice home tomorrow. Palliative care was initiated in the hospital today    aftReview of Systems  Unable to perform ROS: Mental acuity    VITAL SIGNS: Blood pressure (!) 103/53, pulse (!) 109, temperature 97.7 F (36.5 C), temperature source Oral, resp. rate 20, height '5\' 11"'$  (1.803 m), weight 67 kg (147 lb 11.2 oz), SpO2 96 %.  PHYSICAL EXAMINATION:   GENERAL:  83 y.o.-year-old patient lying in the bed with no acute distress, sleeping, unable to wake patient up with light shake and verbal simulation.  the patient is able to open his eyes briefly, does not follow mumbles for  himself  EYES: Pupils not able to assess Not able to assess for scleral icterus. Extraocular muscles intact.  HEENT: Head atraumatic, normocephalic. Oropharynx and nasopharynx clear.  NECK:  Supple, no jugular venous distention. No thyroid enlargement, no tenderness.  LUNGS: Normal breath sounds bilaterally, no wheezing, rales,rhonchi or crepitation. No use of accessory muscles of respiration.  CARDIOVASCULAR: S1, S2 normal. No murmurs, rubs, or gallops.  ABDOMEN: Soft, mild discomfort in upper abdomen, enlarged Liver with painful edge, nondistended. Bowel sounds present. No organomegaly or mass.  EXTREMITIES: No pedal edema, cyanosis, or clubbing.  NEUROLOGIC: Cranial nerves II through XII unable to assess. Muscle strength unable to evaluate this patient is asleep. Sensation unable to evaluate. Gait not checked.  PSYCHIATRIC: The patient is sleeping  SKIN: No obvious rash, lesion, or ulcer.   ORDERS/RESULTS REVIEWED:   CBC  Recent Labs Lab 02/02/17 0713 02/04/17 2219  WBC 5.5 7.1  HGB 14.3 15.1  HCT 44.3 46.5  PLT 154 171  MCV 75.0* 74.2*  MCH 24.2* 24.1*  MCHC 32.3 32.5  RDW 15.4* 15.2*   ------------------------------------------------------------------------------------------------------------------  Chemistries   Recent Labs Lab 02/04/17 2219 02/06/17 0459 02/07/17 0356  NA 134* 133* 133*  K 4.7 4.6  --   CL 102 103  --   CO2 22 25  --   GLUCOSE 222* 103*  --   BUN 28* 18  --   CREATININE 1.40* 0.96  --   CALCIUM 9.8 9.1  --   AST 185* 203*  --   ALT 105* 120*  --   ALKPHOS 230* 237*  --   BILITOT 1.2 1.7*  --    ------------------------------------------------------------------------------------------------------------------  estimated creatinine clearance is 51.4 mL/min (by C-G formula based on SCr of 0.96 mg/dL). ------------------------------------------------------------------------------------------------------------------  Recent Labs  02/04/17 2219   TSH 16.350*    Cardiac Enzymes  Recent Labs Lab 02/04/17 2219  TROPONINI <0.03   ------------------------------------------------------------------------------------------------------------------ Invalid input(s): POCBNP ---------------------------------------------------------------------------------------------------------------  RADIOLOGY: No results found.  EKG:  Orders placed or performed during the hospital encounter of 02/04/17  . EKG 12-Lead  . EKG 12-Lead  . EKG 12-Lead    ASSESSMENT AND PLAN:  Principal Problem:   Failure to thrive in adult Active Problems:   Small cell lung cancer (HCC)   Acute encephalopathy   Lactic acidosis   Abdominal pain   Hyponatremia   Elevated transaminase level #1 failure to thrive in adult, due to metastatic  small cell lung cancer, liver distention, pain, constipation. Palliative care , oncology saw patient in consultation and recommended hospice care, patient's family chose hospice home, patient will be discharged to hospice home tomorrow. Comfort care measures are initiated today.  #2. Acute renal insufficiency, resolved on IV fluids #3. Hyponatremia, worsened with IV fluids, due to  SIADH, now on comfort care measures, no interventions  #4. Elevated transaminases, due to metastatic liver disease, status post recent biopsy, relatively stable LFTs #5. Lactic acidosis, improved with IV fluid administration, no obvious infection,  blood cultures are negative so far #6. Altered mental state after Ativan , Remeron, ammonia level was normal , discontinued the Remeron and Ativan  #7. Agitation as per history, now poorly responsive, off Ativan. Initiate Haldol if needed  8 small cell lung cancer, metastatic, patient was seen by oncologist, hospice was recommended, patient's family chose hospice home, discharging tomorrow    Management plans discussed with the patient's sister and they are in agreement.   DRUG ALLERGIES:  Allergies    Allergen Reactions  . Iodine Swelling  . Other Other (See Comments)  . Penicillins Hives and Rash    CODE STATUS:     Code Status Orders        Start     Ordered   02/05/17 0211  Do not attempt resuscitation (DNR)  Continuous    Question Answer Comment  In the event of cardiac or respiratory ARREST Do not call a "code blue"   In the event of cardiac or respiratory ARREST Do not perform Intubation, CPR, defibrillation or ACLS   In the event of cardiac or respiratory ARREST Use medication by any route, position, wound care, and other measures to relive pain and suffering. May use oxygen, suction and manual treatment of airway obstruction as needed for comfort.      02/05/17 0210    Code Status History    Date Active Date Inactive Code Status Order ID Comments User Context   01/29/2017  1:00 PM 01/31/2017  2:23 PM DNR [id number]  Demetrios Loll, MD Inpatient   01/28/2017 11:29 PM 01/29/2017  1:00 PM Full Code [id number]  Lance Coon, MD Inpatient   08/24/2016  4:14 PM 08/25/2016  6:34 PM Full Code [id number]  Theodoro Grist, MD Inpatient    Advance Directive Documentation     Most Recent Value  Type of Advance Directive  Healthcare Power of Attorney  Pre-existing out of facility DNR order (yellow form or pink MOST form)  -  "MOST" Form in Place?  -      TOTAL TIME TAKING CARE OF THIS PATIENT: 35 minutes.   Discussed with patient's family  Latania Bascomb M.D on 02/07/2017 at 5:46 PM  Between 7am to  6pm - Pager - [phone]  After 6pm go to [email or web address] - password [email or web address] [facility]  Office  [phone]  CC: Primary care physician; Marden Noble, MD

## 2017-02-07 NOTE — Progress Notes (Signed)
Clinical Education officer, museum (CSW) received residential hospice consult. CSW contacted patient's daughter Wayne Le to provide hospice choice. Per daughter she would like [facility] on [address] in [place]. Per daughter Santiago Glad hospice liaison has been in to see her. Per Santiago Glad hospice liaison patient will likely go to the hospice home tomorrow. CSW will continue to follow and assist as needed.   [employer], LCSW [phone]

## 2017-02-07 NOTE — Progress Notes (Signed)
PT Cancellation Note  Patient Details Name: Wayne Le MRN: [id number] DOB: 02-25-1927   Cancelled Treatment:    Reason Eval/Treat Not Completed: Other (comment).  Nursing tech finishing vitals upon PT entering room and as tech was leaving, pt's family entered room.  Pt's family member reporting plan for pt to go to hospice home.  Discussed pt with CM who reported Palliative Care meeting this afternoon to determine plan of care.  No PT eval initiated.  Will hold PT at this time until POC is determined and will initiate PT at a later date/time if appropriate.  Leitha Bleak, PT 02/07/17, 1:49 PM [phone]

## 2017-02-07 NOTE — NC FL2 (Signed)
[place] LEVEL OF CARE SCREENING TOOL     IDENTIFICATION  Patient Name: Wayne Le Birthdate: 1926-11-26 Sex: male Admission Date (Current Location): 02/04/2017  [place] and [place] Number:  Engineering geologist and Address:  [facility], [address] [place],  [address]      Provider Number: [id number]  Attending Physician Name and Address:  Theodoro Grist, MD  Relative Name and Phone Number:       Current Level of Care: Hospital Recommended Level of Care: [place] Prior Approval Number:    Date Approved/Denied:   PASRR Number: [id number]  Discharge Plan: SNF    Current Diagnoses: Patient Active Problem List   Diagnosis Date Noted  . Acute encephalopathy 02/07/2017  . Small cell lung cancer ([place]) 02/07/2017  . Hyponatremia 02/07/2017  . Elevated transaminase level 02/07/2017  . Lactic acidosis 02/07/2017  . Abdominal pain 02/07/2017  . Failure to thrive in adult 02/05/2017  . Constipation 01/28/2017  . Fecal impaction in rectum ([place]) 01/28/2017  . Dementia 01/28/2017  . HTN (hypertension) 01/28/2017  . Atrial fibrillation ([place]) 01/28/2017  . Dehydration 01/28/2017  . Bone metastasis ([place]) 01/27/2017  . Right lower lobe lung mass 01/18/2017  . Mediastinal adenopathy 01/18/2017  . Liver lesion 01/18/2017  . Balance problems 01/18/2017  . Sepsis ([place]) 08/24/2016  . Acute gastroenteritis 08/24/2016  . Community acquired pneumonia 08/24/2016  . Acute renal insufficiency 08/24/2016  . Hyperbilirubinemia 08/24/2016  . Elevated troponin 08/24/2016    Orientation RESPIRATION BLADDER Height & Weight     Self, Place  Normal Incontinent Weight: 147 lb 11.2 oz (67 kg) Height:  '5\' 11"'$  (180.3 cm)  BEHAVIORAL SYMPTOMS/MOOD NEUROLOGICAL BOWEL NUTRITION STATUS   (none)  (none) Continent Diet (heart healthy)  AMBULATORY STATUS COMMUNICATION OF NEEDS Skin   Total Care Verbally Normal                      Personal Care Assistance Level of Assistance  Total care Bathing Assistance: Maximum assistance Feeding assistance: Maximum assistance Dressing Assistance: Maximum assistance Total Care Assistance: Maximum assistance   Functional Limitations Info   (no documented issues)          SPECIAL CARE FACTORS FREQUENCY                       Contractures Contractures Info: Not present    Additional Factors Info  Code Status, Allergies Code Status Info: dnr Allergies Info: iodine, pcns           Current Medications (02/07/2017):  This is the current hospital active medication list Current Facility-Administered Medications  Medication Dose Route Frequency Provider Last Rate Last Dose  . albuterol (PROVENTIL) (2.5 MG/3ML) 0.083% nebulizer solution 2.5 mg  2.5 mg Nebulization Q6H PRN Alexis Hugelmeyer, DO      . aspirin EC tablet 81 mg  81 mg Oral Daily Alexis Hugelmeyer, DO   81 mg at 02/05/17 1051  . docusate sodium (COLACE) capsule 100 mg  100 mg Oral BID Theodoro Grist, MD      . memantine (NAMENDA XR) 24 hr capsule 28 mg  28 mg Oral Daily Alexis Hugelmeyer, DO   28 mg at 02/05/17 1051   And  . donepezil (ARICEPT) tablet 10 mg  10 mg Oral Daily Alexis Hugelmeyer, DO   10 mg at 02/05/17 1051  . dutasteride (AVODART) capsule 0.5 mg  0.5 mg Oral Daily [employer], DO  0.5 mg at 02/05/17 1051  . enoxaparin (LOVENOX) injection 40 mg  40 mg Subcutaneous Q24H Theodoro Grist, MD   40 mg at 02/06/17 2047  . feeding supplement (ENSURE ENLIVE) (ENSURE ENLIVE) liquid 237 mL  237 mL Oral TID BM Theodoro Grist, MD   237 mL at 02/05/17 1600  . ipratropium (ATROVENT) nebulizer solution 0.5 mg  0.5 mg Nebulization Q6H PRN Alexis Hugelmeyer, DO      . lidocaine (LIDODERM) 5 % 2 patch  2 patch Transdermal Q24H Theodoro Grist, MD   2 patch at 02/07/17 0840  . metoprolol tartrate (LOPRESSOR) tablet 25 mg  25 mg Oral BID Alexis Hugelmeyer, DO   25 mg at 02/05/17 2207  . ondansetron  (ZOFRAN) tablet 4 mg  4 mg Oral Q6H PRN Alexis Hugelmeyer, DO       Or  . ondansetron (ZOFRAN) injection 4 mg  4 mg Intravenous Q6H PRN Alexis Hugelmeyer, DO      . oxyCODONE (Oxy IR/ROXICODONE) immediate release tablet 5 mg  5 mg Oral Q4H PRN Alexis Hugelmeyer, DO   5 mg at 02/07/17 0932  . polyethylene glycol (MIRALAX / GLYCOLAX) packet 17 g  17 g Oral Daily Theodoro Grist, MD   17 g at 02/05/17 1051  . senna-docusate (Senokot-S) tablet 1 tablet  1 tablet Oral BID Theodoro Grist, MD   1 tablet at 02/05/17 2207     Discharge Medications: Please see discharge summary for a list of discharge medications.  Relevant Imaging Results:  Relevant Lab Results:   Additional Information ss: [id number]  Shela Leff, LCSW

## 2017-02-07 NOTE — Clinical Social Work Note (Signed)
CSW received consult for New SNF. Palliative Care meeting is scheduled for this afternoon. Possible residential hospice placement. Full assessment to follow if appropriate. CSW will continue to follow.   Darden Dates, MSW, LCSW  Clinical Social Worker  [phone]

## 2017-02-07 NOTE — Plan of Care (Signed)
Problem: Education: Goal: Knowledge of [facility] Education information/materials will improve Outcome: Not Progressing Oriented to person and place. Lethargic. Unable to tolerate anything by mouth due to being sluggish.   Problem: Activity: Goal: Risk for activity intolerance will decrease Outcome: Not Progressing Lethargic. Nonambulatory this shift.   Problem: Nutrition: Goal: Adequate nutrition will be maintained Outcome: Not Progressing Lethargic with decreased po intake

## 2017-02-07 NOTE — Progress Notes (Signed)
Calorie Count Note  48 hour calorie count ordered. Day 1 results below:  Diet: Heart Healthy Supplements: Ensure Enlive po TID as ordered, each supplement provides 350 kcal and 20 grams of protein.  Dinner 3/19: 0 kcal, 0 grams of protein (no dinner) Breakfast 3/20: 25 kcal, 2 grams of protein (bite of yogurt, bite of pancakes and potatoes) Lunch 3/20: 154 kcal, 15 grams of protein (50% of chicken, potatoes, and iced tea) Supplements: N/A (patient did not drink any Ensure)  Total intake: 179 kcal (9% of minimum estimated needs)  17 grams of protein (21% of minimum estimated needs)  Estimated Nutritional Needs:  Kcal:  2010-2210 (30-33 kcal/kg) Protein:  80-100 grams (1.2-1.5 grams/kg) Fluid:  1.6 L/day (25 ml/kg)  Nutrition Dx: Malnutrition (Severe) related to chronic illness (metastatic lung cancer) as evidenced by severe depletion of body fat, severe depletion of muscle mass.  Goal: Patient will meet greater than or equal to 90% of their needs  Intervention: -Continue Ensure Enlive po TID as ordered, each supplement provides 350 kcal and 20 grams of protein. -Recommend liberalizing diet from Heart Healthy to Regular. -Continue calorie count per MD consult. -Will monitor outcome of discussions regarding goals of care.  Willey Blade, MS, RD, LDN Pager: [phone] After Hours Pager: [phone]

## 2017-02-08 MED ORDER — MORPHINE SULFATE (CONCENTRATE) 10 MG/0.5ML PO SOLN: 5.0000 mg | ORAL | 0 refills | Status: AC | PRN

## 2017-02-08 MED ORDER — LORAZEPAM 1 MG PO TABS: 1.0000 mg | ORAL_TABLET | ORAL | 0 refills | Status: AC | PRN

## 2017-02-08 MED ORDER — BISACODYL 10 MG RE SUPP: 10.0000 mg | Freq: Every day | RECTAL | 0 refills | Status: AC | PRN

## 2017-02-08 NOTE — Care Management Important Message (Signed)
Important Message  Patient Details  Name: DERRICO ZHONG MRN: [id number] Date of Birth: [birth year]   Medicare Important Message Given:  Yes    Shelbie Ammons, RN 02/08/2017, 8:22 AM

## 2017-02-08 NOTE — Progress Notes (Signed)
Follow up visit made. Patient seen lying in bed, awakened to name, reported pain, daughter hilda at bedside. Pain reported to staff RN Meghan. Plan is for discharge to the hospice home today. [name] remains in agreement. Discharge summary faxed to referral, report called to the hospice home, EMS notified for transport. Hospital care team all aware. DNR in place in patient's discharge packet. Thank you. Flo Shanks RN, BSN, [place] [facility] [place], [facility] [phone] c

## 2017-02-08 NOTE — Clinical Social Work Note (Signed)
Pt is ready for discharge today and will go to [facility]. Pt's daughter is aware and agreeable to discharge plan. Hospice Liaison made arrangements for transfer. CSW prepared discharge packet. CSW is signing off as no further needs identified.   Darden Dates, MSW, LCSW  Clinical Social Worker  [phone]

## 2017-02-08 NOTE — Discharge Summary (Signed)
[place] [facility] [place] NAME: Wayne Le    MR#:  [id number]  DATE OF BIRTH:  [birth year]  DATE OF ADMISSION:  02/04/2017 ADMITTING PHYSICIAN: Ubaldo Glassing Hugelmeyer, DO  DATE OF DISCHARGE: No discharge date for patient encounter.  PRIMARY CARE PHYSICIAN: Marden Noble, MD     ADMISSION DIAGNOSIS:  Dehydration [E86.0] Pain [R52] Acute kidney injury ([place]) [N17.9] Dementia without behavioral disturbance, unspecified dementia type [F03.90]  DISCHARGE DIAGNOSIS:  Principal Problem:   Failure to thrive in adult Active Problems:   Small cell lung cancer ([place])   Acute encephalopathy   Lactic acidosis   Abdominal pain   Hyponatremia   Elevated transaminase level   Pain   Palliative care by specialist   DNR (do not resuscitate)   SECONDARY DIAGNOSIS:   Past Medical History:  Diagnosis Date  . Arthritis   . Cancer ([place])   . Cataract   . Dysrhythmia    Atrial fib  . Hypertension   . Memory changes   . Prostate disorder     .pro HOSPITAL COURSE:   The patient is a 83 year old male with medical history significant for history of right lower lobe mass, concerning for lung cancer, metastasis to liver and bone, status post liver mass biopsy 02/02/2017, pathology results revealed small cell lung cancer, who presents to the hospital with complaints of upper abdominal pain, constipation, failure to thrive, nausea, weight loss. His labs revealed dehydration with creatinine level of 1.4, elevated liver enzymes, lactic acid level was also elevated. X-ray of abdomen and chest was unremarkable. She was admitted to the hospital for further evaluation and treatment. Urinalysis was unremarkable. He was seen by oncologist, who recommended to follow-up with primary oncologist and palliative care evaluation. Patient was seen by oncologist, who confirmed that patient's biopsy revealed small cell lung cancer, which is incurable, she recommended  hospice. Patient's family was seen by palliative care, they chose hospice home, patient will be discharged to hospice home  Today. Discussion by problem: #1 failure to thrive in adult, due to metastatic  small cell lung cancer, liver distention, pain, constipation. Palliative care , oncology saw patient in consultation and recommended hospice care, patient's family chose hospice home, patient will be discharged to hospice home today, continue palliative care.  #2. Acute renal insufficiency, resolved on IV fluids #3. Hyponatremia, worsened with IV fluids, due to  SIADH, now on comfort care measures, no interventions  #4. Elevated transaminases, due to metastatic liver disease, status post recent biopsy, relatively stable LFTs #5. Lactic acidosis, improved with IV fluid administration, no obvious infection,  blood cultures are negative #6. Altered mental state after Ativan , Remeron, ammonia level was normal , discontinued the Remeron and Ativan ,. The patient is more alert today #7. Agitation , became poorly responsive with Ativan, recommend low doses if needed.Marland Kitchen  8 small cell lung cancer, metastatic, patient was seen by oncologist, hospice was recommended, patient's family chose hospice home, discharging today  DISCHARGE CONDITIONS:   poor  CONSULTS OBTAINED:  Treatment Team:  Lequita Asal, MD Cammie Sickle, MD  DRUG ALLERGIES:   Allergies  Allergen Reactions  . Iodine Swelling  . Other Other (See Comments)  . Penicillins Hives and Rash    DISCHARGE MEDICATIONS:   Current Discharge Medication List    START taking these medications   Details  bisacodyl (DULCOLAX) 10 MG suppository Place 1 suppository (10 mg total) rectally daily as needed for  mild constipation or moderate constipation. Qty: 12 suppository, Refills: 0    feeding supplement, ENSURE ENLIVE, (ENSURE ENLIVE) LIQD Take 237 mLs by mouth 3 (three) times daily between meals. Qty: 237 mL, Refills: 12      LORazepam (ATIVAN) 1 MG tablet Take 1 tablet (1 mg total) by mouth every 4 (four) hours as needed for anxiety. Qty: 30 tablet, Refills: 0    Morphine Sulfate (MORPHINE CONCENTRATE) 10 MG/0.5ML SOLN concentrated solution Take 0.25 mLs (5 mg total) by mouth every 2 (two) hours as needed for moderate pain, severe pain or shortness of breath. Qty: 30 mL, Refills: 0    senna-docusate (SENOKOT-S) 8.6-50 MG tablet Take 1 tablet by mouth 2 (two) times daily. Qty: 60 tablet, Refills: 1      STOP taking these medications     aspirin EC 81 MG tablet      docusate sodium (COLACE) 50 MG capsule      dutasteride (AVODART) 0.5 MG capsule      metoprolol tartrate (LOPRESSOR) 25 MG tablet      mirtazapine (REMERON) 15 MG tablet      NAMZARIC 28-10 MG CP24      oxyCODONE-acetaminophen (PERCOCET/ROXICET) 5-325 MG tablet      polyethylene glycol (MIRALAX) packet          DISCHARGE INSTRUCTIONS:    No follow up  If you experience worsening of your admission symptoms, develop shortness of breath, life threatening emergency, suicidal or homicidal thoughts you must seek medical attention immediately by calling 911 or calling your MD immediately  if symptoms less severe.  You Must read complete instructions/literature along with all the possible adverse reactions/side effects for all the Medicines you take and that have been prescribed to you. Take any new Medicines after you have completely understood and accept all the possible adverse reactions/side effects.   Please note  You were cared for by a hospitalist during your hospital stay. If you have any questions about your discharge medications or the care you received while you were in the hospital after you are discharged, you can call the unit and asked to speak with the hospitalist on call if the hospitalist that took care of you is not available. Once you are discharged, your primary care physician will handle any further medical issues.  Please note that NO REFILLS for any discharge medications will be authorized once you are discharged, as it is imperative that you return to your primary care physician (or establish a relationship with a primary care physician if you do not have one) for your aftercare needs so that they can reassess your need for medications and monitor your lab values.    Today   CHIEF COMPLAINT:   Chief Complaint  Patient presents with  . Altered Mental Status    HISTORY OF PRESENT ILLNESS:  Wayne Le  is a 83 y.o. male with a known history of right lower lobe mass, concerning for lung cancer, metastasis to liver and bone, status post liver mass biopsy 02/02/2017, pathology results revealed small cell lung cancer, who presents to the hospital with complaints of upper abdominal pain, constipation, failure to thrive, nausea, weight loss. His labs revealed dehydration with creatinine level of 1.4, elevated liver enzymes, lactic acid level was also elevated. X-ray of abdomen and chest was unremarkable. She was admitted to the hospital for further evaluation and treatment. Urinalysis was unremarkable. He was seen by oncologist, who recommended to follow-up with primary oncologist and palliative care evaluation. Patient  was seen by oncologist, who confirmed that patient's biopsy revealed small cell lung cancer, which is incurable, she recommended hospice. Patient's family was seen by palliative care, they chose hospice home, patient will be discharged to hospice home  Today. Discussion by problem: #1 failure to thrive in adult, due to metastatic  small cell lung cancer, liver distention, pain, constipation. Palliative care , oncology saw patient in consultation and recommended hospice care, patient's family chose hospice home, patient will be discharged to hospice home today, continue palliative care.  #2. Acute renal insufficiency, resolved on IV fluids #3. Hyponatremia, worsened with IV fluids, due to  SIADH,  now on comfort care measures, no interventions  #4. Elevated transaminases, due to metastatic liver disease, status post recent biopsy, relatively stable LFTs #5. Lactic acidosis, improved with IV fluid administration, no obvious infection,  blood cultures are negative #6. Altered mental state after Ativan , Remeron, ammonia level was normal , discontinued the Remeron and Ativan ,. The patient is more alert today #7. Agitation , became poorly responsive with Ativan, recommend low doses if needed.Marland Kitchen  8 small cell lung cancer, metastatic, patient was seen by oncologist, hospice was recommended, patient's family chose hospice home, discharging today   VITAL SIGNS:  Blood pressure 114/69, pulse (!) 115, temperature 97.6 F (36.4 C), temperature source Oral, resp. rate 19, height '5\' 11"'$  (1.803 m), weight 67 kg (147 lb 11.2 oz), SpO2 93 %.  I/O:   Intake/Output Summary (Last 24 hours) at 02/08/17 0855 Last data filed at 02/07/17 1400  Gross per 24 hour  Intake              130 ml  Output                0 ml  Net              130 ml    PHYSICAL EXAMINATION:  GENERAL:  83 y.o.-year-old patient lying in the bed with no acute distress.  EYES: Pupils equal, round, reactive to light and accommodation. No scleral icterus. Extraocular muscles intact.  HEENT: Head atraumatic, normocephalic. Oropharynx and nasopharynx clear.  NECK:  Supple, no jugular venous distention. No thyroid enlargement, no tenderness.  LUNGS: Normal breath sounds bilaterally, no wheezing, rales,rhonchi or crepitation. No use of accessory muscles of respiration.  CARDIOVASCULAR: S1, S2 normal. No murmurs, rubs, or gallops.  ABDOMEN: Soft, non-tender, non-distended. Bowel sounds present. No organomegaly or mass.  EXTREMITIES: No pedal edema, cyanosis, or clubbing.  NEUROLOGIC: Cranial nerves II through XII are intact. Muscle strength 5/5 in all extremities. Sensation intact. Gait not checked.  PSYCHIATRIC: The patient is alert  and oriented x 3.  SKIN: No obvious rash, lesion, or ulcer.   DATA REVIEW:   CBC  Recent Labs Lab 02/04/17 2219  WBC 7.1  HGB 15.1  HCT 46.5  PLT 171    Chemistries   Recent Labs Lab 02/06/17 0459 02/07/17 0356  NA 133* 133*  K 4.6  --   CL 103  --   CO2 25  --   GLUCOSE 103*  --   BUN 18  --   CREATININE 0.96  --   CALCIUM 9.1  --   AST 203*  --   ALT 120*  --   ALKPHOS 237*  --   BILITOT 1.7*  --     Cardiac Enzymes  Recent Labs Lab 02/04/17 2219  TROPONINI <0.03    Microbiology Results  Results for orders placed or performed during  the hospital encounter of 02/04/17  Blood culture (routine x 2)     Status: None (Preliminary result)   Collection Time: 02/05/17 12:10 AM  Result Value Ref Range Status   Specimen Description BLOOD LFA  Final   Special Requests BOTTLES DRAWN AEROBIC AND ANAEROBIC LOW VOLUME  Final   Culture NO GROWTH 3 DAYS  Final   Report Status PENDING  Incomplete  Blood culture (routine x 2)     Status: None (Preliminary result)   Collection Time: 02/05/17 12:20 AM  Result Value Ref Range Status   Specimen Description BLOOD LFA  Final   Special Requests BOTTLES DRAWN AEROBIC AND ANAEROBIC LOW VOLUME  Final   Culture NO GROWTH 3 DAYS  Final   Report Status PENDING  Incomplete    RADIOLOGY:  No results found.  EKG:   Orders placed or performed during the hospital encounter of 02/04/17  . EKG 12-Lead  . EKG 12-Lead  . EKG 12-Lead      Management plans discussed with the patient, family and they are in agreement.  CODE STATUS:     Code Status Orders        Start     Ordered   02/05/17 0211  Do not attempt resuscitation (DNR)  Continuous    Question Answer Comment  In the event of cardiac or respiratory ARREST Do not call a "code blue"   In the event of cardiac or respiratory ARREST Do not perform Intubation, CPR, defibrillation or ACLS   In the event of cardiac or respiratory ARREST Use medication by any route,  position, wound care, and other measures to relive pain and suffering. May use oxygen, suction and manual treatment of airway obstruction as needed for comfort.      02/05/17 0210    Code Status History    Date Active Date Inactive Code Status Order ID Comments User Context   01/29/2017  1:00 PM 01/31/2017  2:23 PM DNR [id number]  Demetrios Loll, MD Inpatient   01/28/2017 11:29 PM 01/29/2017  1:00 PM Full Code [id number]  Lance Coon, MD Inpatient   08/24/2016  4:14 PM 08/25/2016  6:34 PM Full Code [id number]  Theodoro Grist, MD Inpatient    Advance Directive Documentation     Most Recent Value  Type of Advance Directive  Healthcare Power of Attorney  Pre-existing out of facility DNR order (yellow form or pink MOST form)  -  "MOST" Form in Place?  -      TOTAL TIME TAKING CARE OF THIS PATIENT: 35  minutes.    Theodoro Grist M.D on 02/08/2017 at 8:55 AM  Between 7am to 6pm - Pager - [phone]  After 6pm go to [email or web address] - password [email or web address] [facility]  Office  [phone]  CC: Primary care physician; Marden Noble, MD

## 2017-02-08 NOTE — Progress Notes (Addendum)
Visit made. Patient seen lying in bed, eyes closed, did rouse to name briefly and report pain, staff RN Tiffany made aware. He remains with very little oral intake and sleeping most of the time. Daughters [name] and [name] present, both met earlier with Palliative Medicine NP Wadie Lessen and have decided to focus on comfort and have requested a transfer to the hospice home. Writer initiated education regarding hospice services, philosophy and team approach to care with good understanding voiced. Questions answered, consents signed. Updated patient information faxed to referral. Plan is for transport to the hospice home tomorrow 3/21. Writer to arrange. Hospital care team and family all aware and in agreement. Patient will nee d signed DNR to accompany him. [facility] team updated. Thank you Flo Shanks RN, BSN, [facility] [place] [phone] c

## 2017-02-09 ENCOUNTER — Other Ambulatory Visit: Payer: Self-pay | Admitting: Hematology and Oncology

## 2017-02-10 LAB — CULTURE, BLOOD (ROUTINE X 2)
Culture: NO GROWTH
Culture: NO GROWTH

## 2017-02-19 DEATH — deceased

## 2017-10-06 IMAGING — CT CT CHEST W/ CM
2 of 5 series · 11 of 36 positions shown, 13 images · IV contrast (iopamidol)
Comparison: Chest CT 04/07/2014 and abdominopelvic CT 05/16/2013

CLINICAL DATA: Generalized abdominal pain with constipation 1
month. Cough.

EXAM:
CT CHEST, ABDOMEN, AND PELVIS WITH CONTRAST
TECHNIQUE: Multidetector CT imaging of the chest, abdomen and pelvis was
performed following the standard protocol during bolus
administration of intravenous contrast.
CONTRAST:  100mL [id number] IOPAMIDOL ([id number]) INJECTION 61%

[Series 2: cap with · axial · 0.71mm/px · z∈[-733,-193]mm · 8 of 132 slices shown, 10 images]
[im 12/132  mediastinal]
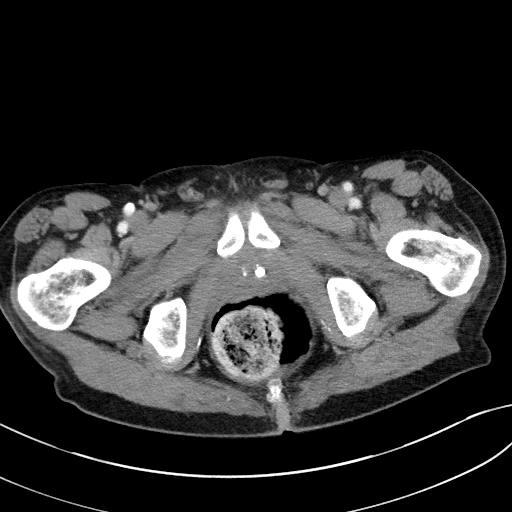
[im 12/132  lung]
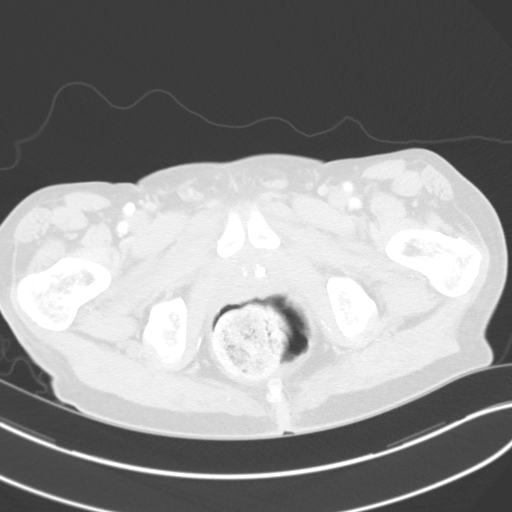
[im 24/132  lung]
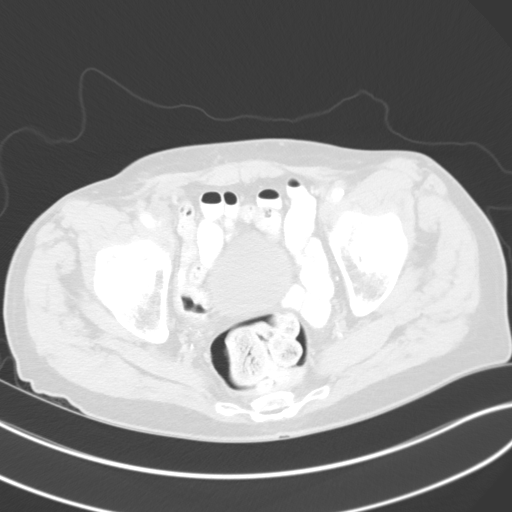
[im 48/132  lung]
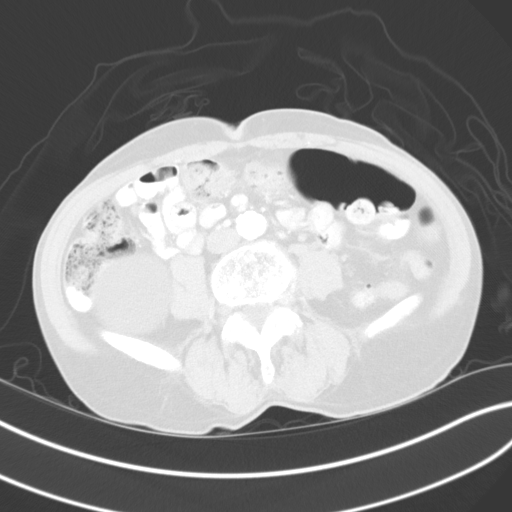
[im 60/132  lung]
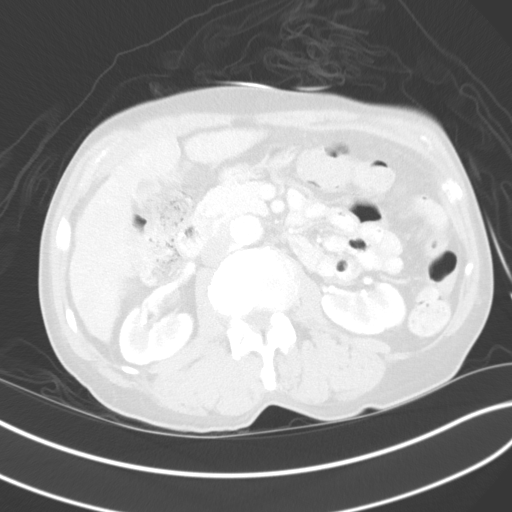
[im 72/132  mediastinal]
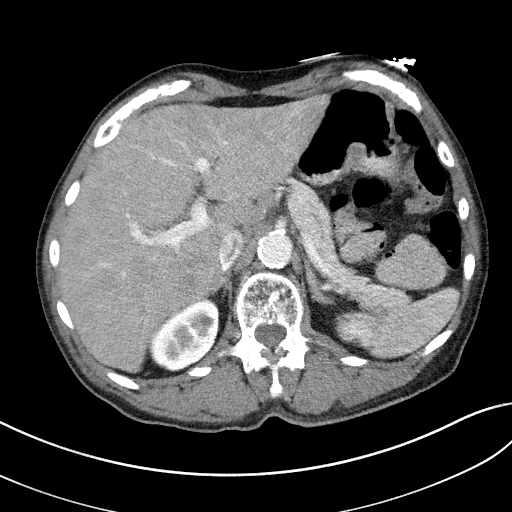
[im 72/132  lung]
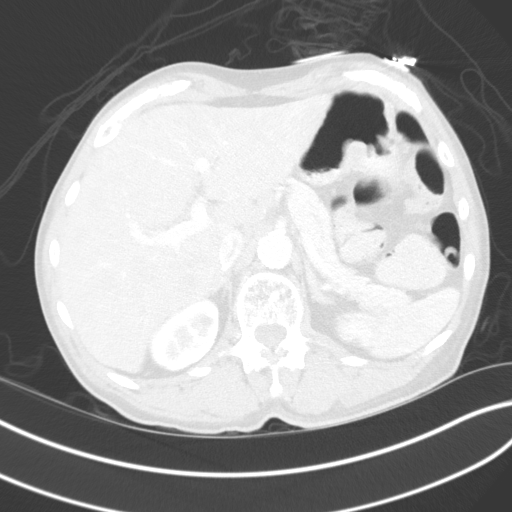
[im 84/132  lung]
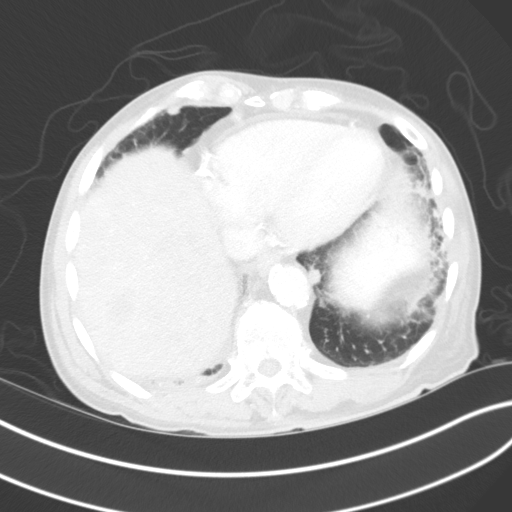
[im 108/132  lung]
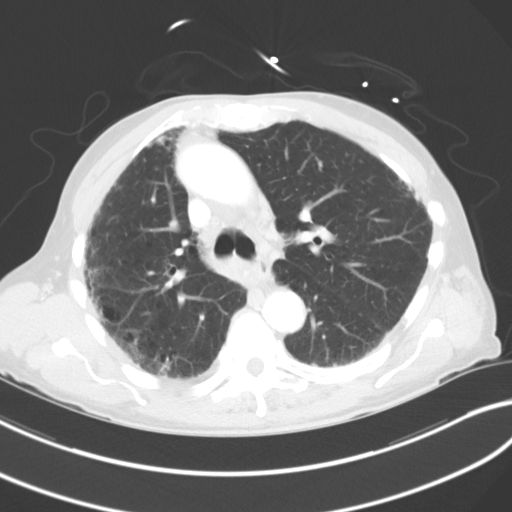
[im 120/132  lung]
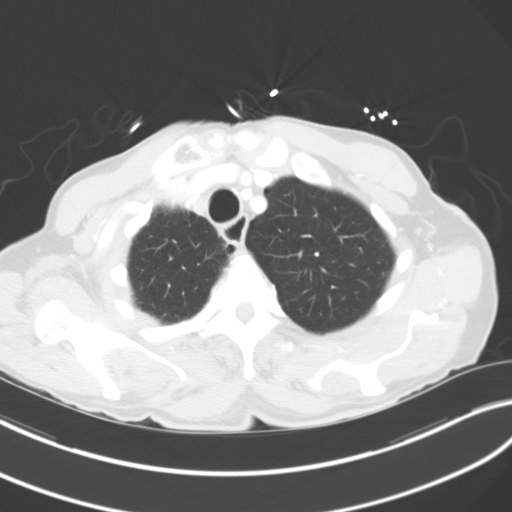

[Series 5: coronals · coronal · 0.64mm/px · 3 of 137 slices shown]
[im 28/137  lung]
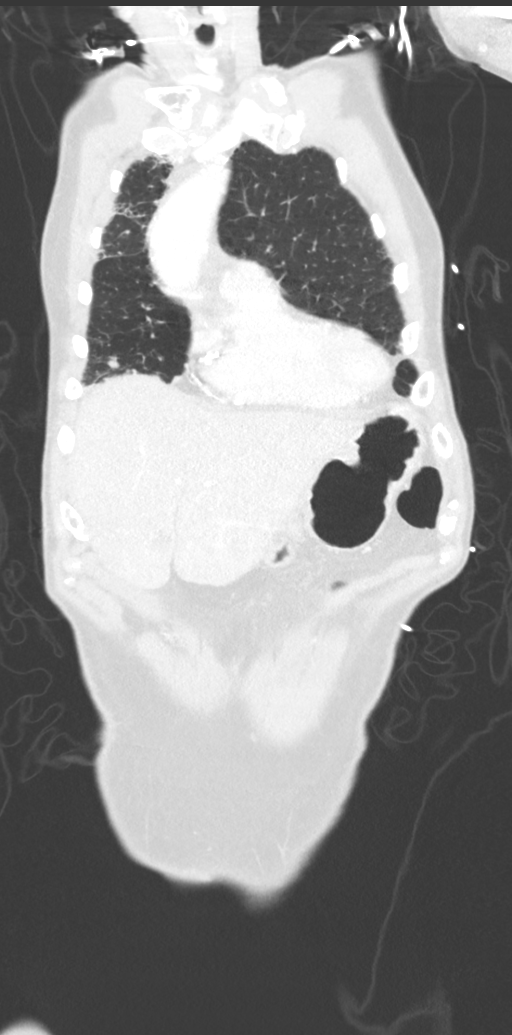
[im 55/137  lung]
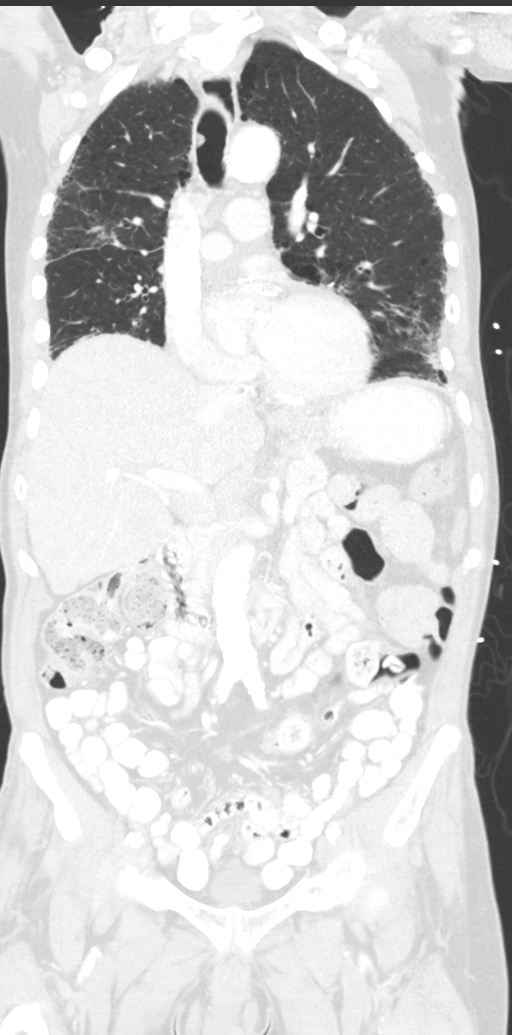
[im 82/137  lung]
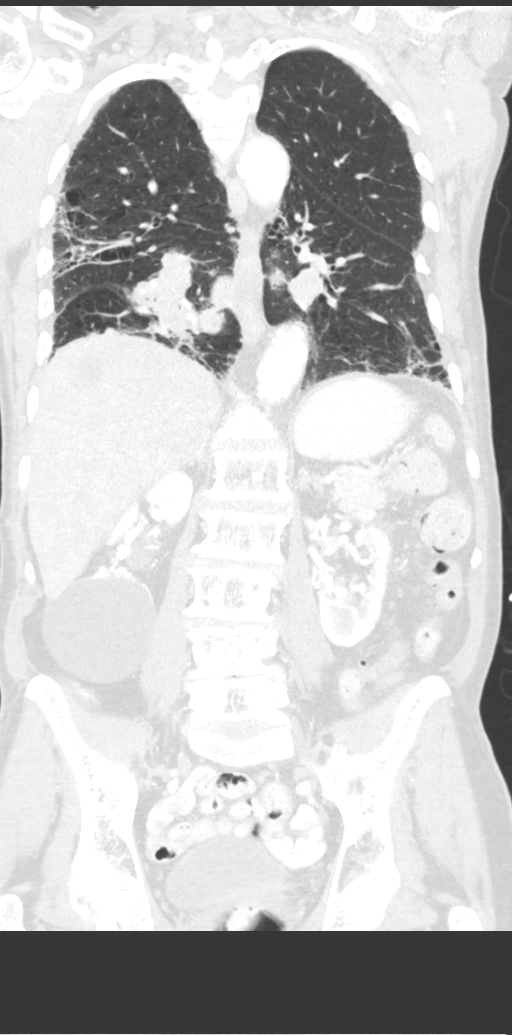

[11 of 36 positions shown; findings below may reference images not displayed]

FINDINGS: CT CHEST FINDINGS

Cardiovascular: There is mild she megaly with calcified plaque over
the left main and 3 vessel coronary arteries. Moderate calcified
plaque over the thoracic aorta. Ascending thoracic aorta measures
3.7 cm in AP diameter. Remaining vascular structures unremarkable.

Mediastinum/Nodes: High pretracheal lymph node measuring 2.2 cm by
short axis. There is bilateral hilar adenopathy. 1 cm precarinal
lymph node. Subcentimeter right pericardial phrenic lymph node.

Lungs/Pleura: Lungs are adequately inflated demonstrate diffuse
emphysematous disease. There is evidence of patient's known patchy
bilateral fibrosis without significant change. There is new focal
airspace consolidation over the right lower lobe which is somewhat
masslike with scattered bilateral nodular airspace process. No
significant effusion. Aspirate material within the right side of the
trachea. There is also material within the right lower lobe bronchus
with complete opacification of the right lower lobe bronchus. These
findings may related multifocal pneumonia due to aspiration, however
neoplasm with metastatic disease is not excluded.

Musculoskeletal: Degenerative change of the spine.

CT ABDOMEN PELVIS FINDINGS

Hepatobiliary: Multiple ill-defined low-density liver masses with
the largest over the dome of the right lobe measuring 2.6 cm likely
metastatic disease. Gallbladder and biliary tree are within normal.

Pancreas: Within normal.

Spleen: Within normal.

Adrenals/Urinary Tract: Adrenal glands are normal. Kidneys are
normal in size and demonstrate several right renal cysts with the
largest over the lower pole measuring 7.1 cm. The bladder and
ureters are unremarkable.

Stomach/Bowel: Stomach and small bowel are unremarkable. Appendix is
normal. Moderate fecal retention over the rectosigmoid colon as the
colon is otherwise unremarkable.

Vascular/Lymphatic: Moderate calcified plaque over the abdominal
aorta and iliac arteries.

Reproductive: Within normal.

Other: No free fluid or focal inflammatory change.

Musculoskeletal: Degenerative changes of the spine. Degenerative
change of the hips. Findings suggesting avascular necrosis of the
left femoral head with suggestion of osteoarthritic change in
avascular necrosis of the right femoral head.
IMPRESSION: Masslike consolidation the right lower lobe with scattered bilateral
discrete nodular opacities. Bilateral hilar and mediastinal
adenopathy. Aspirate material within the trachea and right lower
lobe bronchi causing right lower lobe bronchial obstruction.
Findings likely due to right lower lobe primary lung cancer with
metastatic disease. Cannot exclude superimposed infection. Recommend
tissue sampling for further evaluation.

Multiple low-density liver lesions compatible with metastatic
disease.

Emphysematous disease with scattered fibrotic changes.

Mild cardiomegaly. Left main and 3 vessel atherosclerotic coronary
artery disease. Aortic atherosclerosis.

Mild ectasia of the ascending thoracic aorta measuring 3.7 cm.
Recommend annual imaging followup by CTA or MRA. This recommendation
follows 1888 ACCF/AHA/AATS/ACR/ASA/SCA/PITIOT/PIERCE/JIM/JOOSUNG Guidelines
for the Diagnosis and Management of Patients with Thoracic Aortic
Disease. Circulation.1888; 121: e266-e369.

Right renal cysts with the largest measuring 7.1 cm over the lower
pole.

Left femoral head avascular necrosis impossible right femoral head
avascular necrosis with secondary osteoarthritis.

## 2017-10-06 IMAGING — CR DG ABDOMEN 2V
3 series · 3 of 3 positions shown · non-contrast
Comparison: CT abdomen pelvis 05/16/2013

CLINICAL DATA: Constipation

EXAM:
ABDOMEN - 2 VIEW

[abdomen erect]
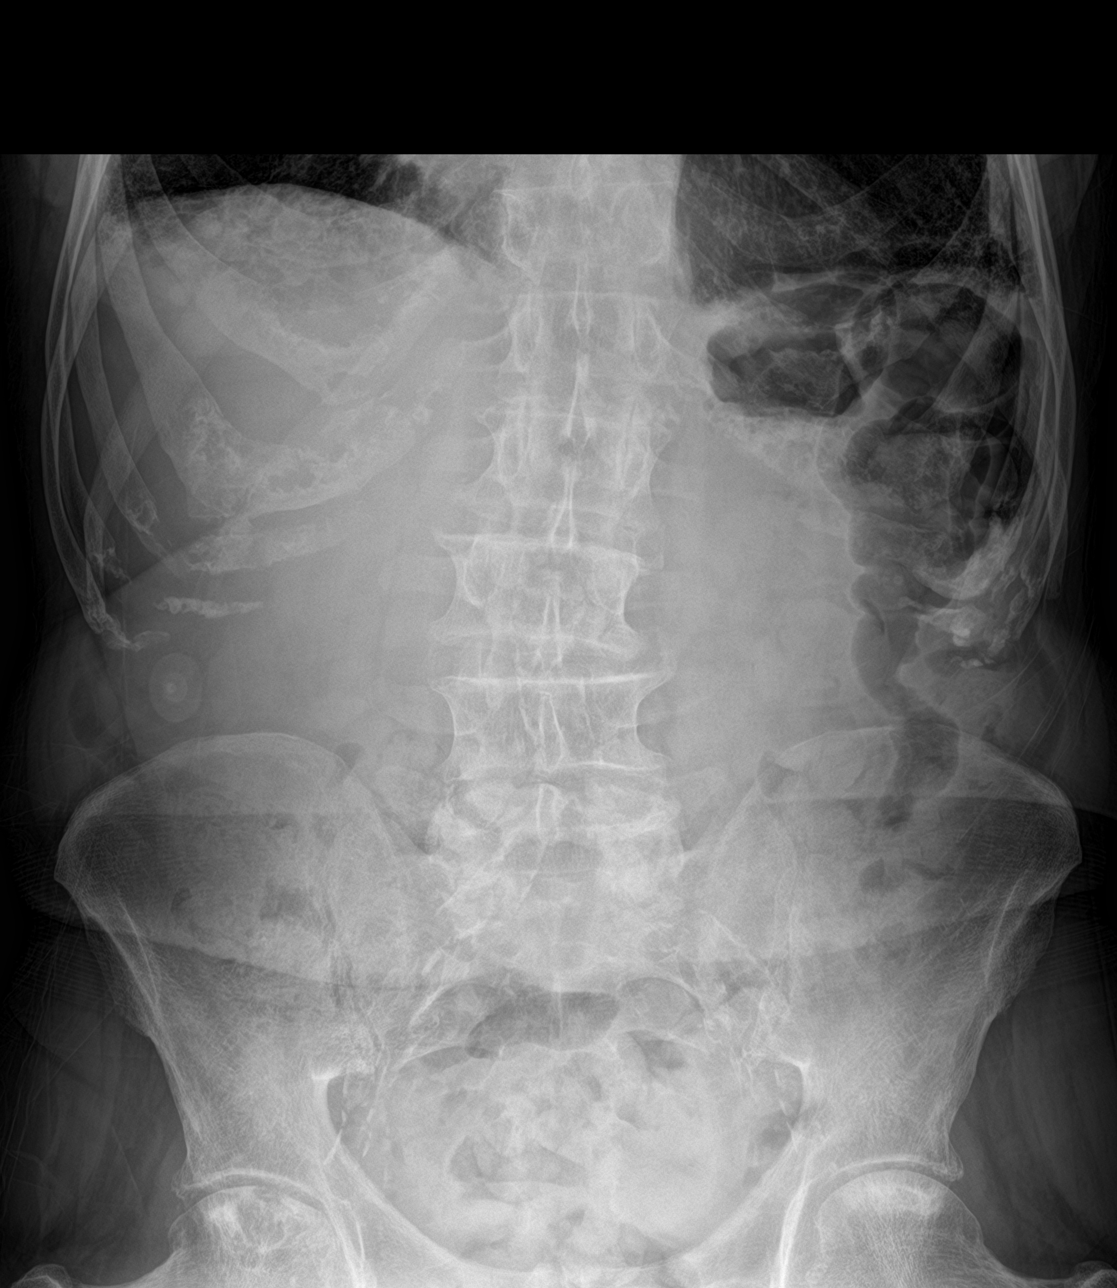

[abdomen supine (1 of 2)]
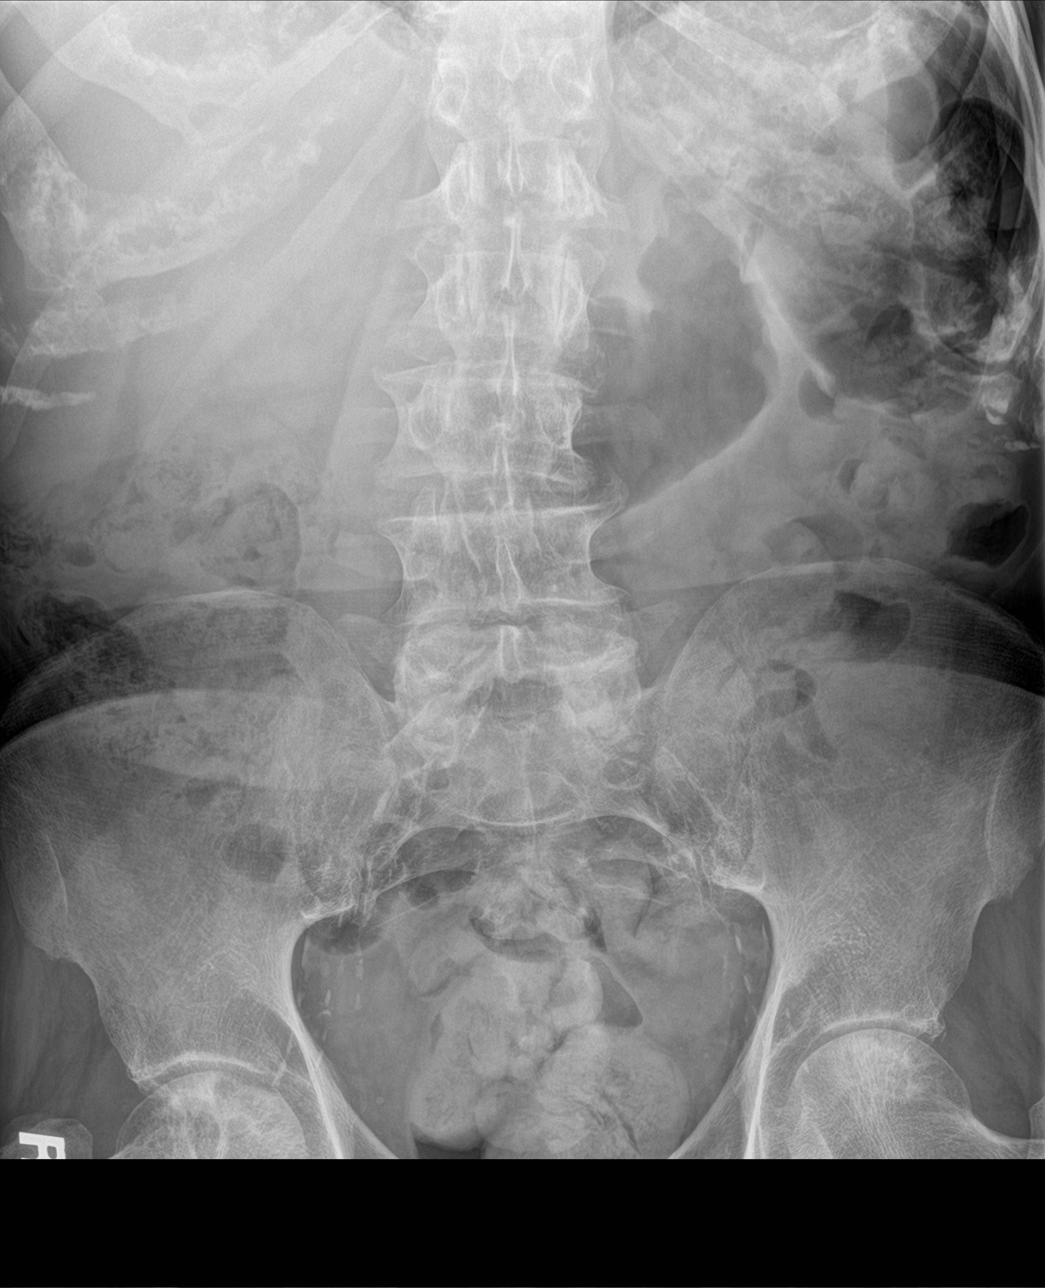

[abdomen supine (2 of 2)]
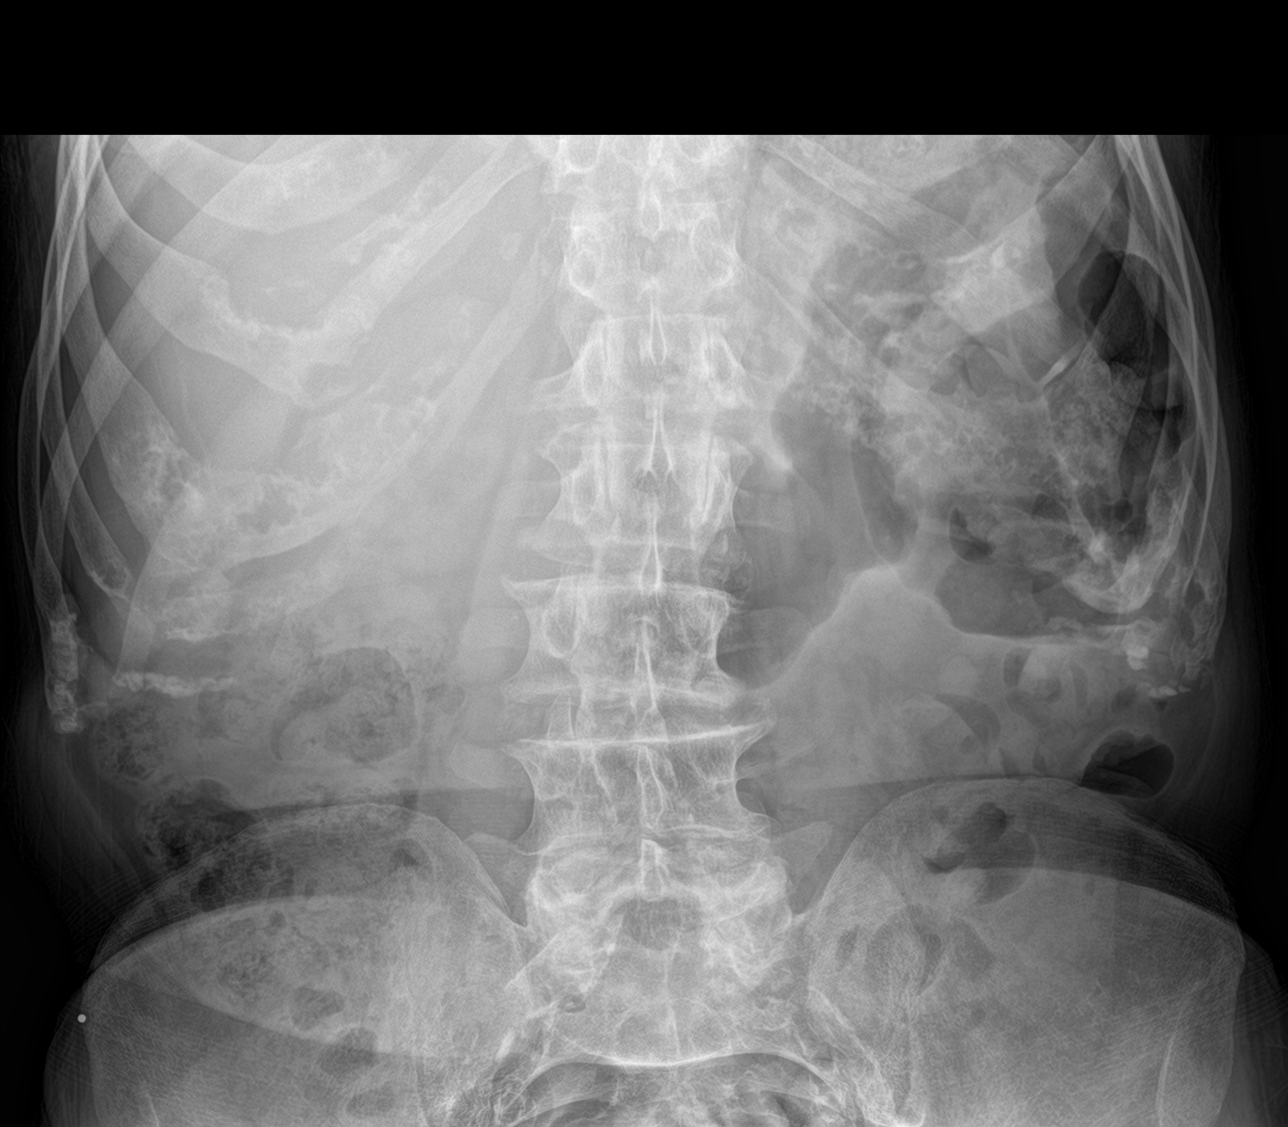

[3 of 3 positions shown; findings below may reference images not displayed]

FINDINGS: Mild amount of stool in the colon and rectum. Negative for bowel
obstruction. No free air.

Atherosclerotic disease. Lumbar scoliosis and mild disc
degeneration. AVN of the femoral head bilaterally.
IMPRESSION: Mild amount of stool in the colon and rectum. Negative for bowel
obstruction

AVN of the femoral head bilaterally without fracture.

## 2017-10-19 IMAGING — CR DG ABDOMEN ACUTE W/ 1V CHEST
1 series · 4 of 4 positions shown · non-contrast
Comparison: 01/23/2017; 01/15/2017; chest CT - 01/15/2017; PET-CT -
01/25/2017

CLINICAL DATA: Constipation for the past 5 days. History of lung
cancer and smoking.

EXAM:
DG ABDOMEN ACUTE W/ 1V CHEST

[Series 1: dg abd acute w/chest · 0.14mm/px · 4 of 4 slices shown]
[im 1/4]
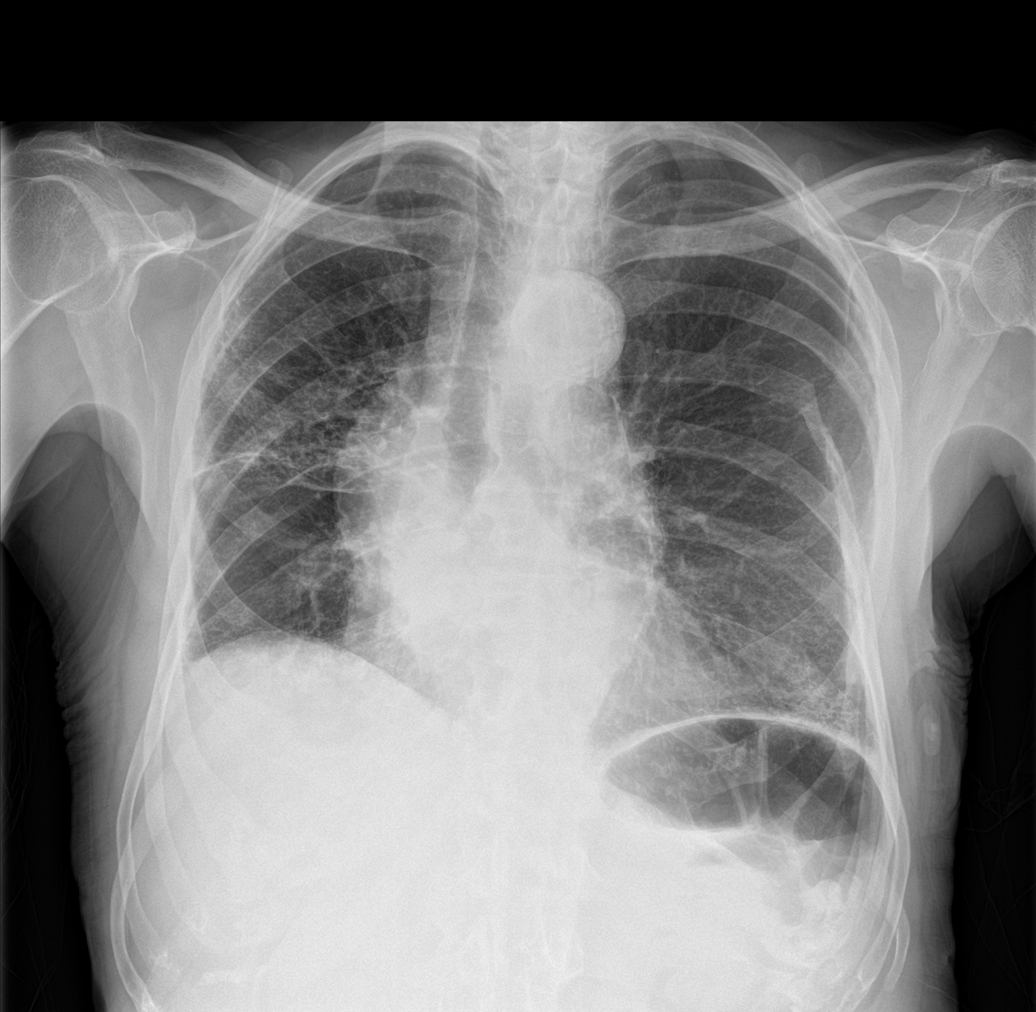
[im 2/4]
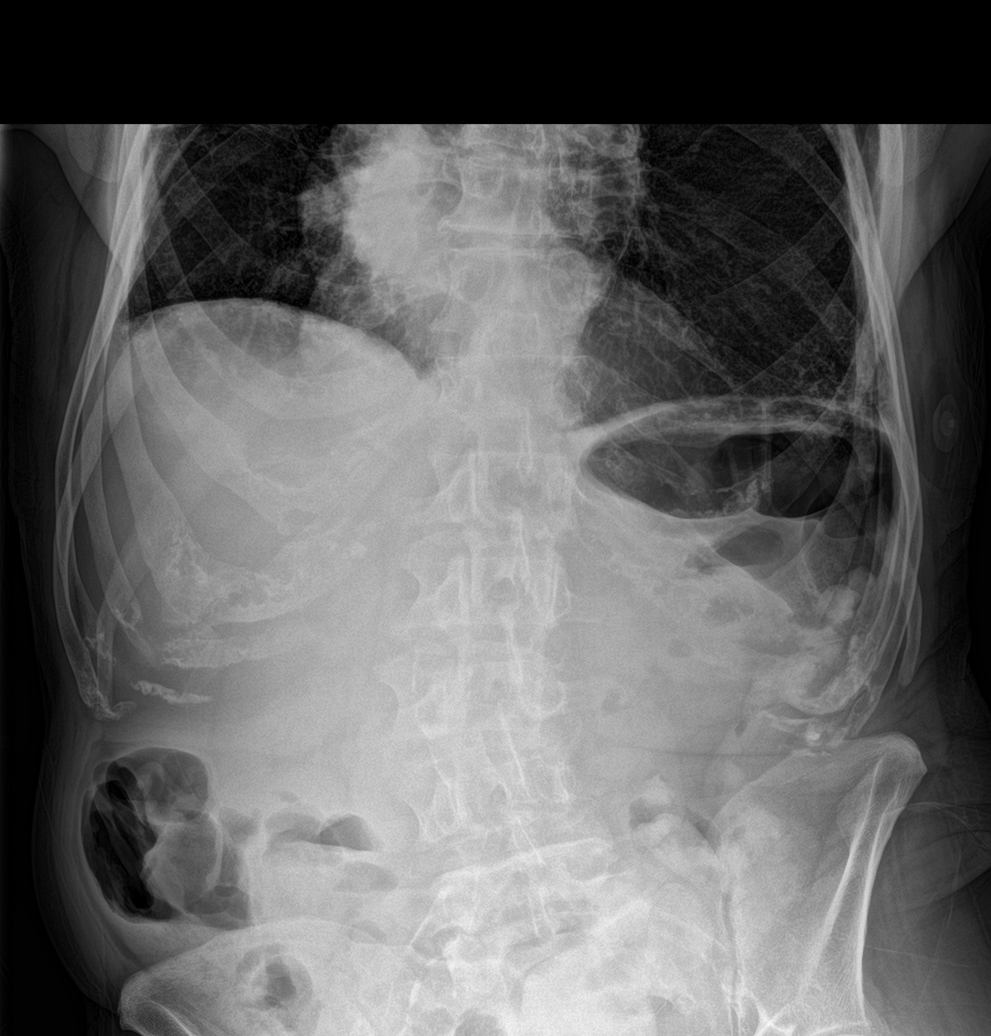
[im 3/4]
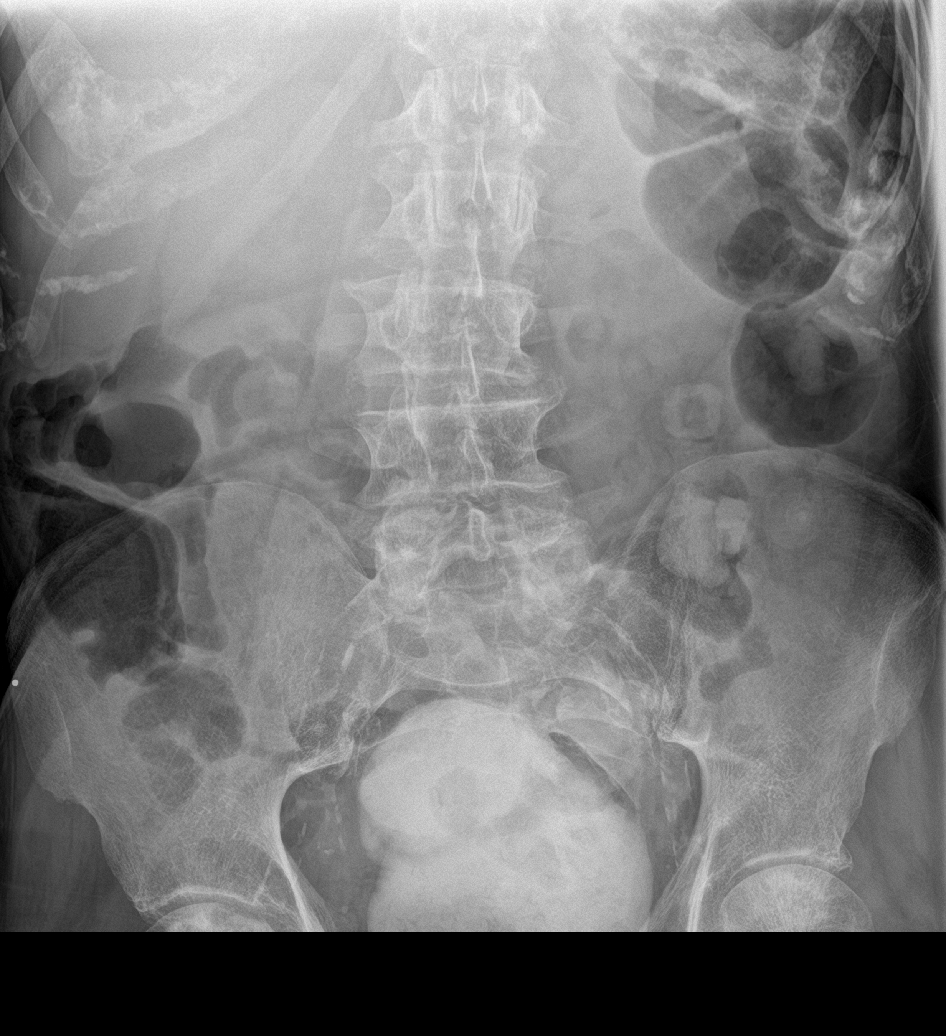
[im 4/4]
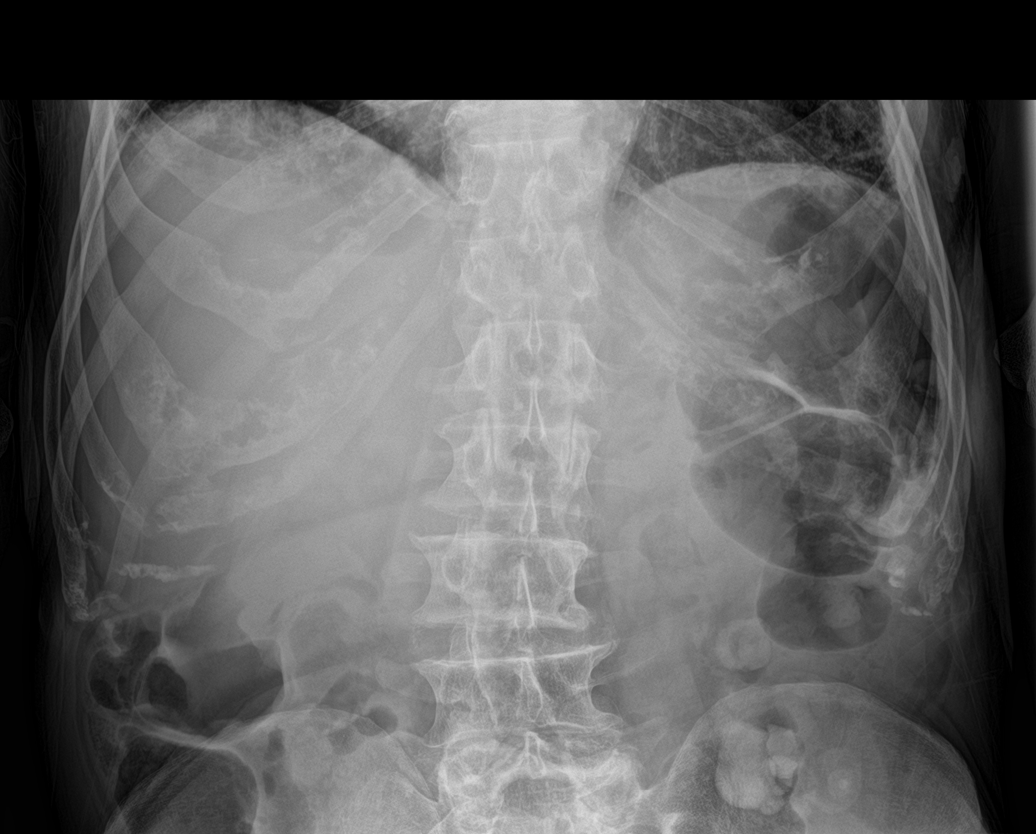

[4 of 4 positions shown; findings below may reference images not displayed]

FINDINGS: Grossly unchanged cardiac silhouette and mediastinal contours with
atherosclerotic plaque within a tortuous and potentially ectatic
thoracic aorta. Known right perihilar mass is grossly unchanged.
Architectural distortion and bronchiectasis within the peripheral
aspect the right mid lung the left costophrenic angle is also
unchanged. Unchanged right-sided volume loss with mild elevation the
right hemidiaphragm. No new focal airspace opacities. No pleural
effusion or pneumothorax.

Marked distention of the rectum with radiopaque enteric contrast and
stool. Mild gaseous distention of the upstream colon. No evidence of
enteric obstruction. No pneumoperitoneum, pneumatosis or portal
venous gas.

Deformity involving the seventh rib is unchanged. Mild scoliotic
curvature of the thoracolumbar spine, potentially positional.
Avascular necrosis involving the bilateral hip femoral heads, right
greater than left.
IMPRESSION: 1. Marked distention of the rectum with radiopaque enteric contrast
and stool.
2. Findings suggestive of colonic ileus without evidence of enteric
obstruction.
3. Stable examination of the chest, including known right perihilar
mass and scattered areas of atelectasis/scar, without superimposed
acute cardiopulmonary disease.
4.  Aortic Atherosclerosis ([id number])

## 2017-10-26 IMAGING — CR DG ABDOMEN ACUTE W/ 1V CHEST
1 series · 4 of 4 positions shown · non-contrast
Comparison: CT abdomen and pelvis 01/28/2017. Abdominal series
01/28/17.

CLINICAL DATA: Recently discharged with small bowel obstruction
this past week. Patient has not been eating since the discharge.
Increased weakness. Difficulty walking. Incontinence of urine and
stool. One year progressive mental status change.

EXAM:
DG ABDOMEN ACUTE W/ 1V CHEST

[Series 1: dg abd acute w/chest · 0.14mm/px · 4 of 4 slices shown]
[im 1/4]
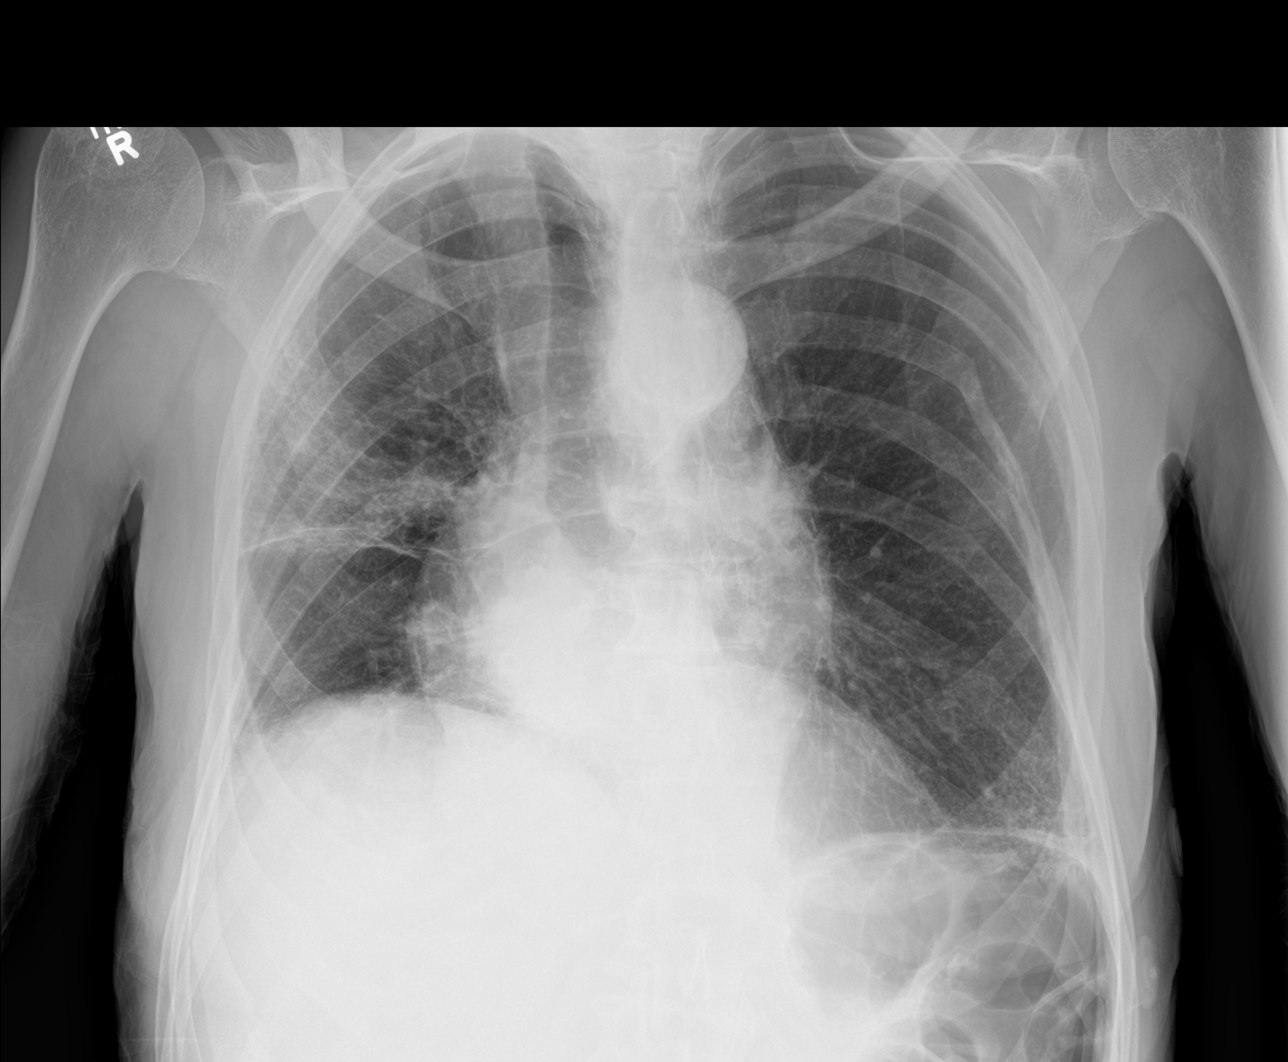
[im 2/4]
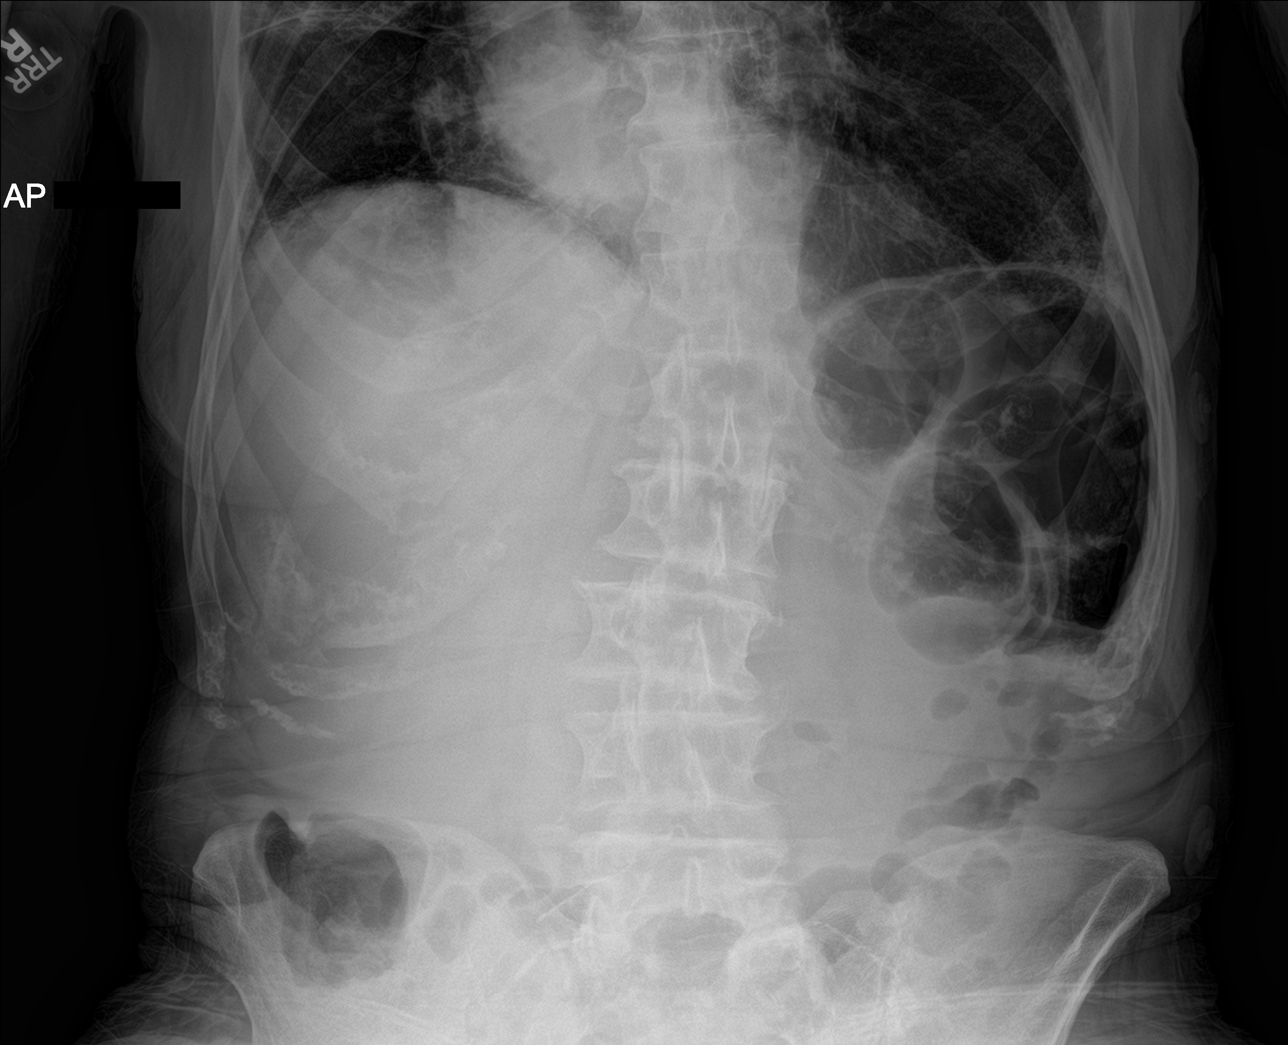
[im 3/4]
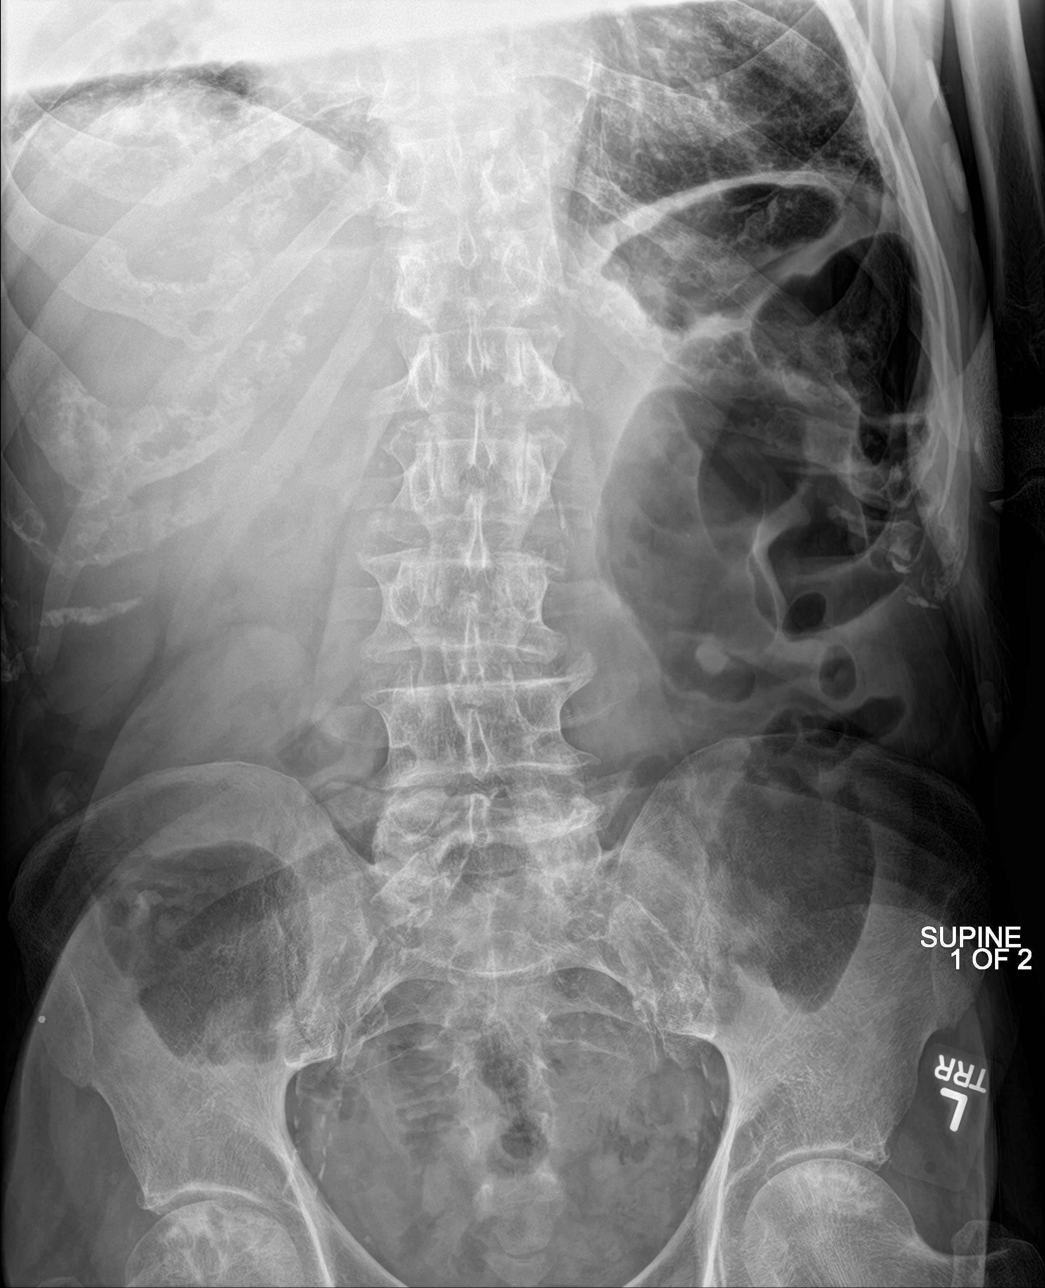
[im 4/4]
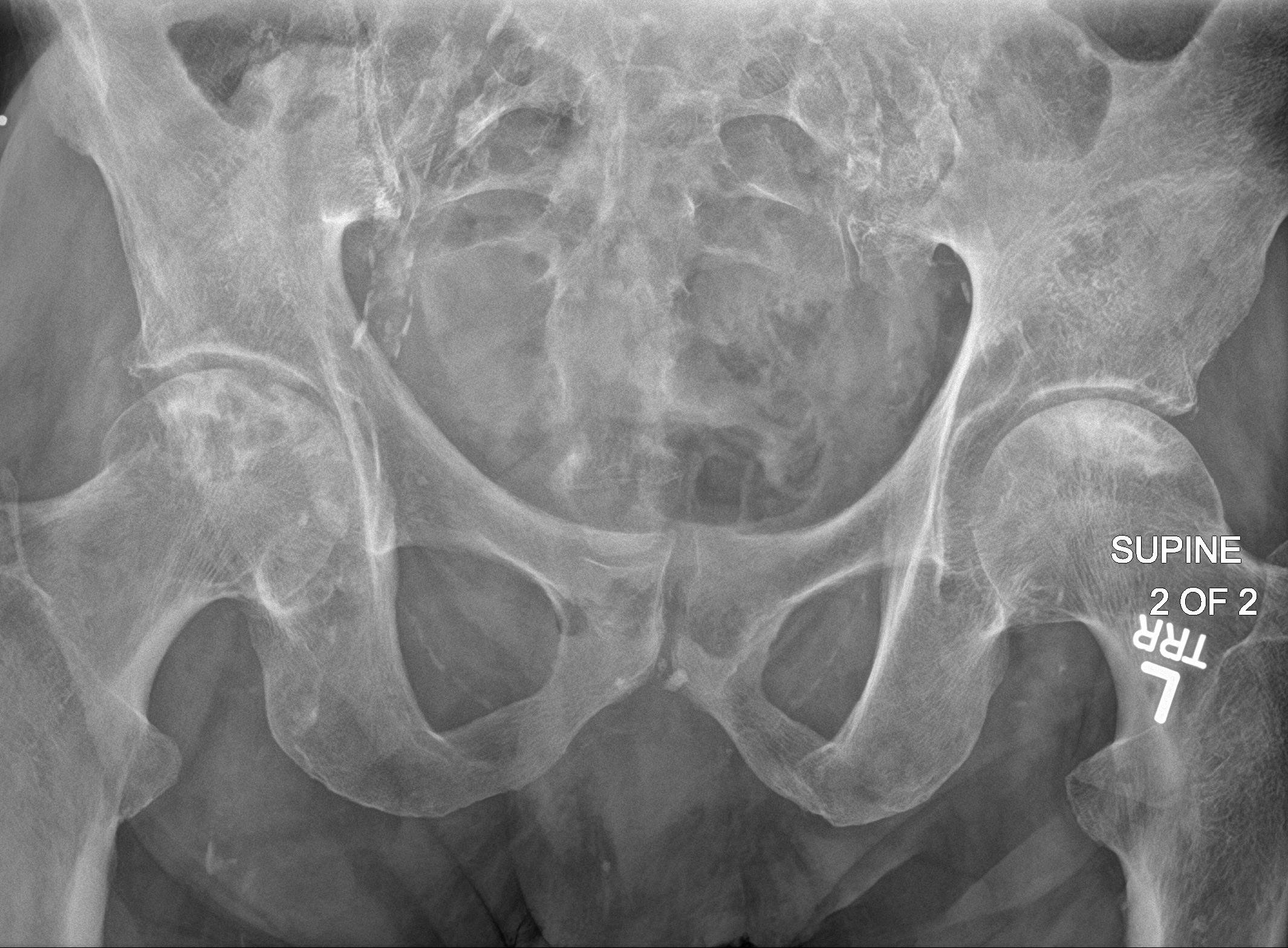

[4 of 4 positions shown; findings below may reference images not displayed]

FINDINGS: Elevation of the right hemidiaphragm with atelectasis in the right
lung base. Patchy areas of fibrosis or scarring in the right mid
lung and left lower lung. Known right lower lobe mass is
demonstrated behind the heart shadow at the costophrenic angle. The
pulmonary nodules seen in the lower lungs on previous CT are not as
well depicted radiographically. No blunting of costophrenic angles.
No pneumothorax. Calcified and tortuous aorta. Heart size is normal.
Old left rib deformity.

Scattered gas and stool throughout the colon. No large or small
bowel distention. Changes likely due to ileus or dysmotility. No
free intra- abdominal air. No abnormal air-fluid levels.
Degenerative changes in the spine. Make sclerosis and lytic changes
in the humeral heads bilaterally could represent avascular necrosis
or bone metastasis.
IMPRESSION: Unchanged appearance of the chest. Again demonstrated is a a right
lower lobe mass with elevation of the right hemidiaphragm and patchy
areas of fibrosis or scarring in the right mid lung and left lower
lung.

Nonobstructive bowel gas pattern. Lucent and sclerotic changes in
the femoral heads bilaterally could indicate bone metastasis or
avascular necrosis.

## 2018-04-20 IMAGING — US US BIOPSY
1 series · 13 of 15 positions shown · non-contrast
Comparison: none

CLINICAL DATA: Right lower lobe lung mass and diffuse metastatic
lesions throughout the liver. The patient presents for liver biopsy
to establish tissue diagnosis.

[Series 1: us biopsy · 0.24mm/px · 13 of 15 slices shown]
[im 1/15]
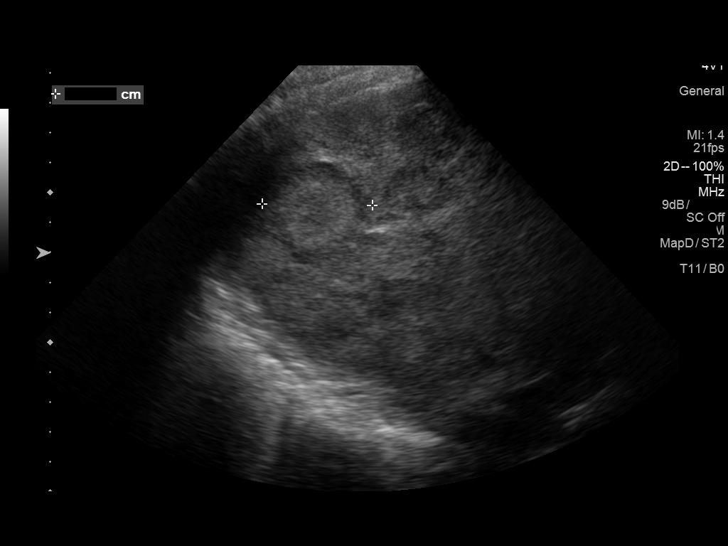
[im 2/15]
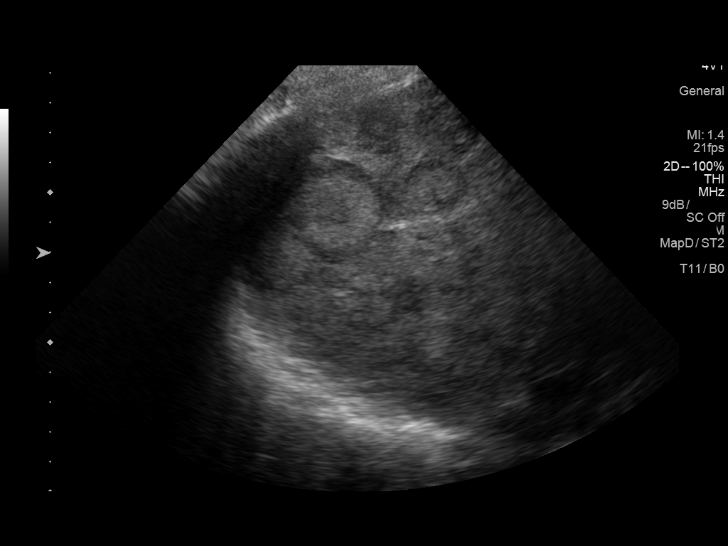
[im 3/15]
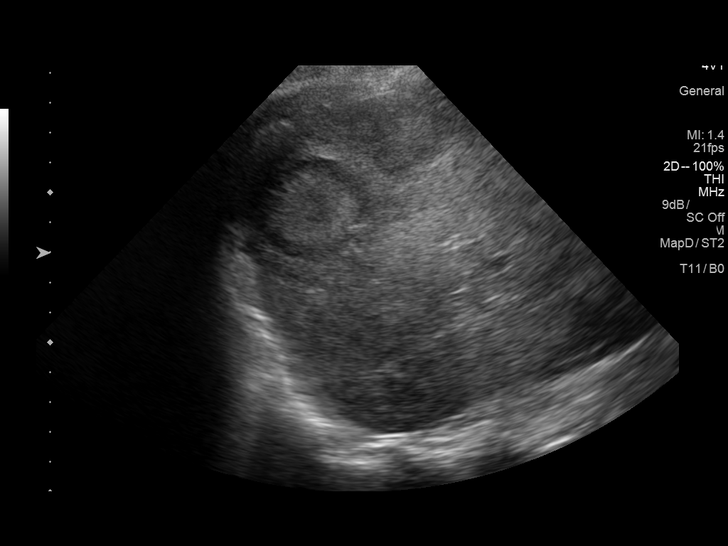
[im 5/15]
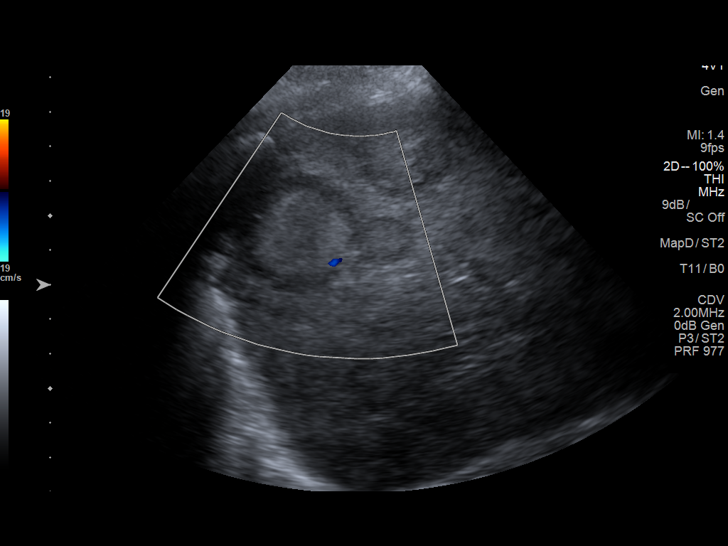
[im 6/15]
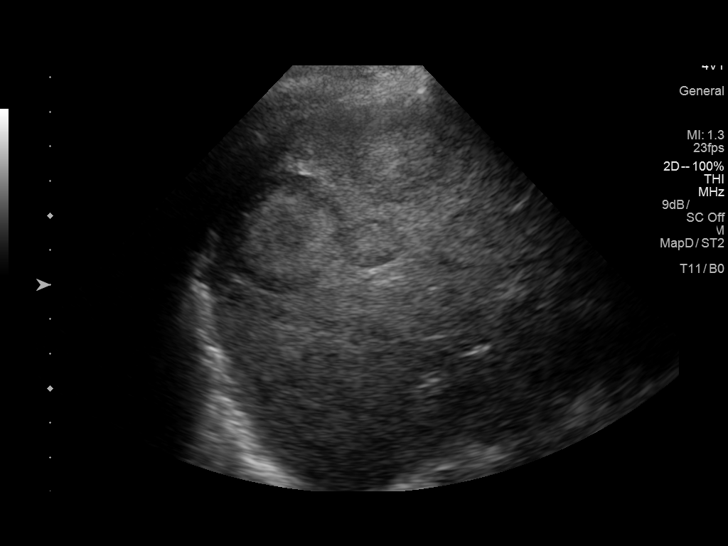
[im 7/15]
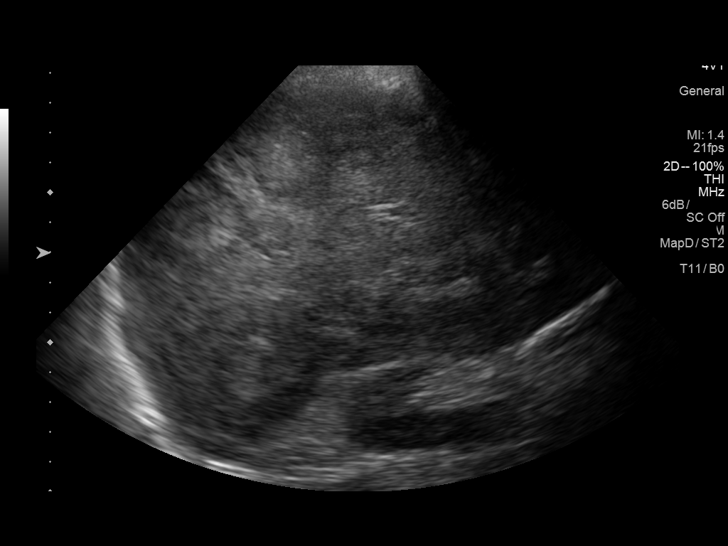
[im 8/15]
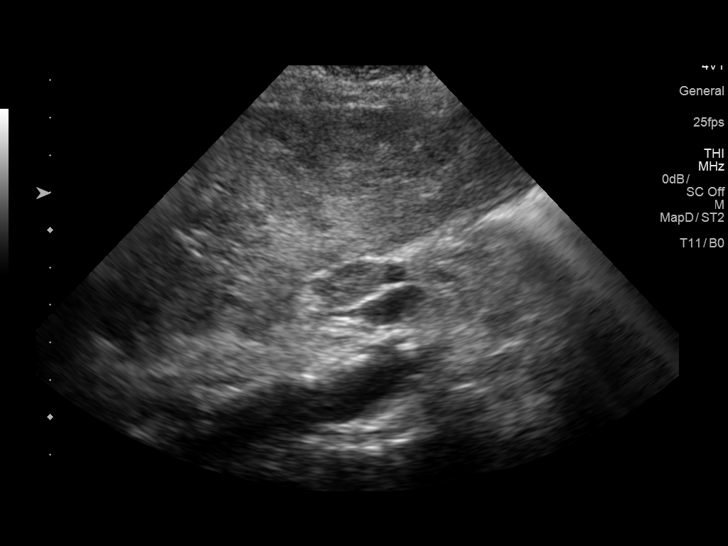
[im 9/15]
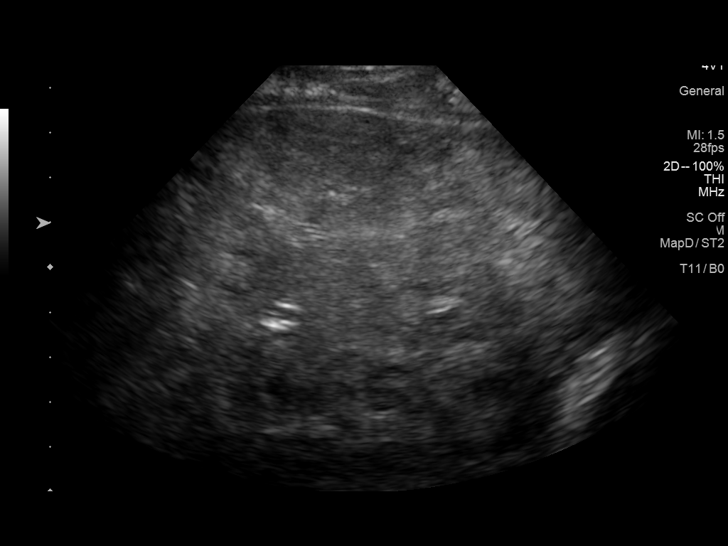
[im 10/15]
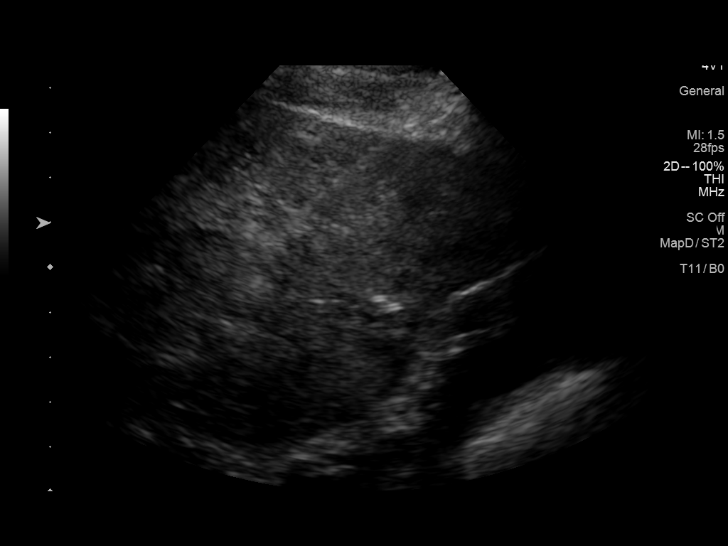
[im 11/15]
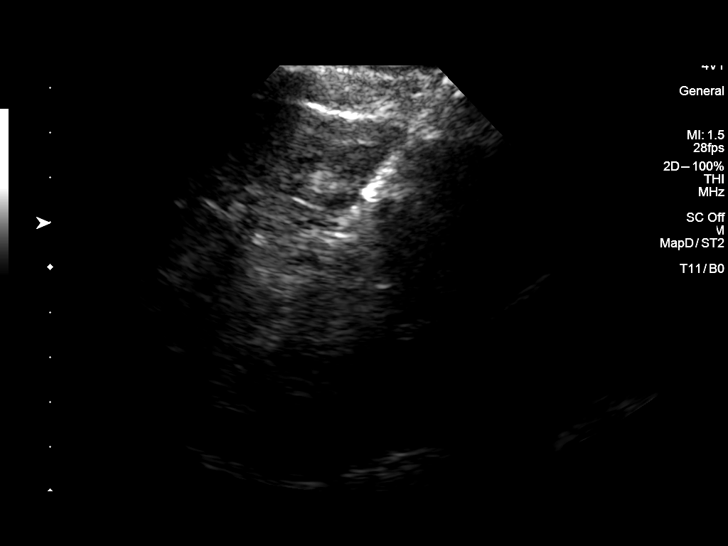
[im 13/15]
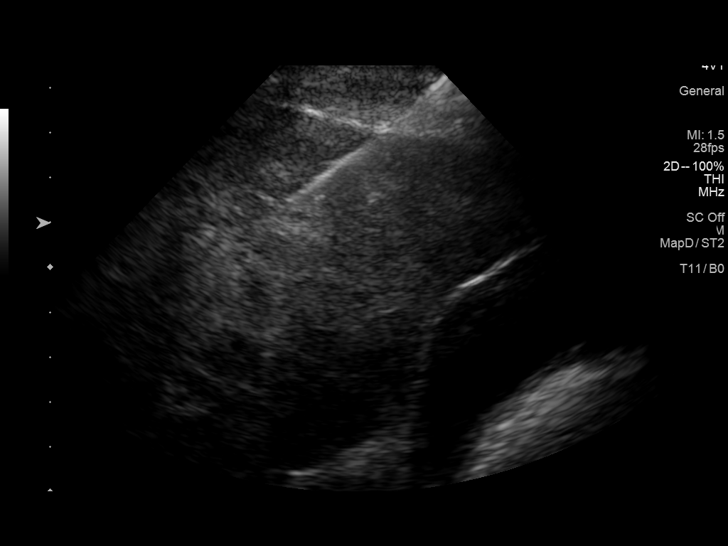
[im 14/15]
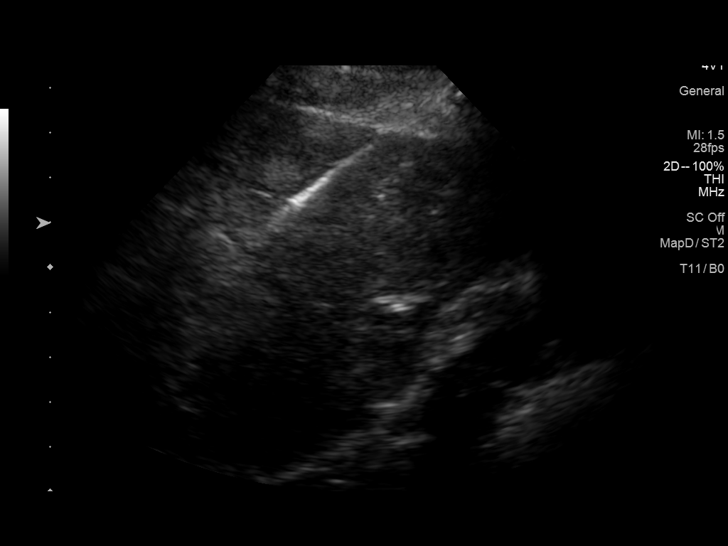
[im 15/15]
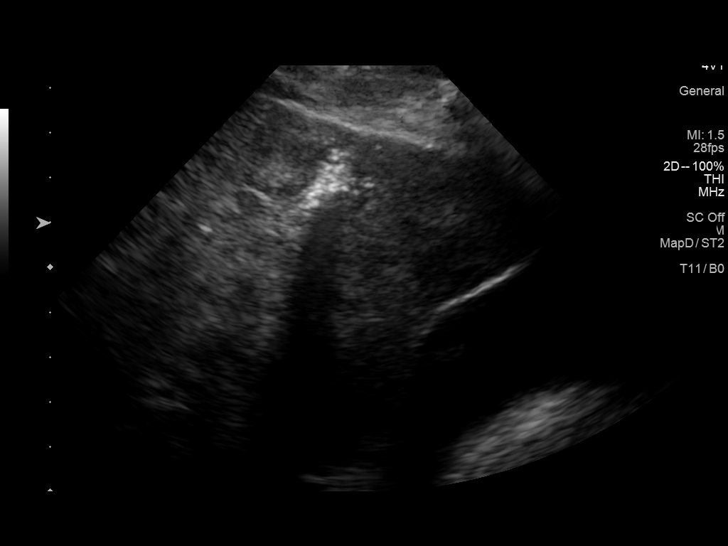

[13 of 15 positions shown; findings below may reference images not displayed]

EXAM:
ULTRASOUND GUIDED CORE BIOPSY OF LIVER

MEDICATIONS:
25 mcg IV Fentanyl

The patient was not formally sedated as he was already sleeping
during the procedure and did not require formal moderate sedation.

PROCEDURE:
The procedure, risks, benefits, and alternatives were explained to
the patient. Questions regarding the procedure were encouraged and
answered. The patient understands and consents to the procedure. A
time out was performed prior to initiating the procedure.

The right abdominal wall was prepped with chlorhexidine in a sterile
fashion, and a sterile drape was applied covering the operative
field. A sterile gown and sterile gloves were used for the
procedure. Local anesthesia was provided with 1% Lidocaine.

Ultrasound was used to localize liver lesions. Under direct
ultrasound guidance, a 17 gauge needle was advanced into the right
lobe of the liver. A total of 3 coaxial 18 gauge core biopsy samples
were obtained through liver parenchyma. Core biopsy samples were
submitted in formalin. As the outer needle was retracted, several
Gel-Foam pledgets were advanced into the liver parenchyma.
Additional ultrasound was performed after the outer needle removal.

COMPLICATIONS:
None.
FINDINGS: The liver parenchyma is almost entirely replaced by tumor. Core
biopsy was performed within the right lobe towards the inferior
aspect of the right lobe through confluent tumor.
IMPRESSION: Ultrasound-guided core biopsy performed of tumor within the right
lobe of the liver.
